# Patient Record
Sex: Female | Born: 1990 | Race: Black or African American | Hispanic: No | Marital: Single | Smoking: Never smoker
Health system: Southern US, Community
[De-identification: ages and names within clinical notes are randomized; demographics above are authoritative.]

## PROBLEM LIST (undated history)

## (undated) ENCOUNTER — Inpatient Hospital Stay (HOSPITAL_COMMUNITY): Payer: Self-pay

## (undated) DIAGNOSIS — A6000 Herpesviral infection of urogenital system, unspecified: Secondary | ICD-10-CM

## (undated) DIAGNOSIS — D573 Sickle-cell trait: Secondary | ICD-10-CM

## (undated) DIAGNOSIS — A749 Chlamydial infection, unspecified: Secondary | ICD-10-CM

## (undated) HISTORY — DX: Sickle-cell trait: D57.3

## (undated) HISTORY — PX: DILATION AND CURETTAGE OF UTERUS: SHX78

## (undated) HISTORY — PX: DENTAL SURGERY: SHX609

---

## 2012-12-08 ENCOUNTER — Encounter (HOSPITAL_COMMUNITY): Payer: Self-pay | Admitting: *Deleted

## 2012-12-08 ENCOUNTER — Inpatient Hospital Stay (HOSPITAL_COMMUNITY)
Admission: AD | Admit: 2012-12-08 | Discharge: 2012-12-08 | Disposition: A | Discharge status: Home / Self Care | Payer: Medicaid Other | Source: Ambulatory Visit | Attending: Obstetrics & Gynecology | Admitting: Obstetrics & Gynecology

## 2012-12-08 DIAGNOSIS — M549 Dorsalgia, unspecified: Secondary | ICD-10-CM | POA: Insufficient documentation

## 2012-12-08 DIAGNOSIS — O99891 Other specified diseases and conditions complicating pregnancy: Secondary | ICD-10-CM | POA: Insufficient documentation

## 2012-12-08 DIAGNOSIS — Z3201 Encounter for pregnancy test, result positive: Secondary | ICD-10-CM

## 2012-12-08 DIAGNOSIS — Z3481 Encounter for supervision of other normal pregnancy, first trimester: Secondary | ICD-10-CM

## 2012-12-08 DIAGNOSIS — R109 Unspecified abdominal pain: Secondary | ICD-10-CM | POA: Insufficient documentation

## 2012-12-08 DIAGNOSIS — R1084 Generalized abdominal pain: Secondary | ICD-10-CM

## 2012-12-08 LAB — POCT PREGNANCY, URINE: Preg Test, Ur: POSITIVE — AB

## 2012-12-08 LAB — URINALYSIS, ROUTINE W REFLEX MICROSCOPIC
Hgb urine dipstick: NEGATIVE
Nitrite: POSITIVE — AB
Specific Gravity, Urine: >1.03 — ABNORMAL HIGH (ref 1.005–1.030)
Urobilinogen, UA: 0.2 mg/dL (ref 0.0–1.0)
pH: 5.5 (ref 5.0–8.0)

## 2012-12-08 LAB — URINE MICROSCOPIC-ADD ON

## 2012-12-08 NOTE — MAU Provider Note (Signed)
History     CSN: [id number]  Arrival date and time: 12/08/12 7829   First Provider Initiated Contact with Patient 12/08/12 2246      Chief Complaint  Patient presents with  . Abdominal Pain  . Possible Pregnancy   HPI Bridget Leach 22 y.o. [redacted]w[redacted]d   Client has had midline back pain and nausea for several day.  Feels bad when she awakens.  Client was unaware she was pregnant.  Stunned by positive pregnancy test.  This week applied for insurance online and took papers to the [facility].   OB History   Grav Para Term Preterm Abortions TAB SAB Ect Mult Living   1               History reviewed. No pertinent past medical history.  Past Surgical History  Procedure Laterality Date  . Dental surgery      History reviewed. No pertinent family history.  History  Substance Use Topics  . Smoking status: Never Smoker   . Smokeless tobacco: Not on file  . Alcohol Use: No    Allergies: No Known Allergies  No prescriptions prior to admission    Review of Systems  Constitutional: Negative for fever.  Gastrointestinal: Positive for nausea. Negative for vomiting and abdominal pain.  Genitourinary:       No vaginal discharge. No vaginal bleeding. No dysuria.  Musculoskeletal: Positive for back pain.   Physical Exam   Blood pressure 117/71, pulse 93, temperature 98.3 F (36.8 C), temperature source Oral, resp. rate 18, height 5\' 2"  (1.575 m), weight 166 lb (75.297 kg), last menstrual period 09/04/2012, SpO2 100.00%.  Physical Exam  Nursing note and vitals reviewed. Constitutional: She is oriented to person, place, and time. She appears well-developed and well-nourished.  HENT:  Head: Normocephalic.  Eyes: EOM are normal.  Neck: Neck supple.  GI: Soft. There is no tenderness.  FHT 153 with doppler.  Musculoskeletal: Normal range of motion.  Midline sacral pain  - no CVA tenderness.  Neurological: She is alert and oriented to person, place, and time.  Skin: Skin is warm and  dry.  Psychiatric: She has a normal mood and affect.    MAU Course  Procedures Results for orders placed during the hospital encounter of 12/08/12 (from the past 24 hour(s))  URINALYSIS, ROUTINE W REFLEX MICROSCOPIC     Status: Abnormal   Collection Time    12/08/12  7:29 PM      Result Value Range   Color, Urine YELLOW  YELLOW   APPearance CLEAR  CLEAR   Specific Gravity, Urine >1.030 (*) 1.005 - 1.030   pH 5.5  5.0 - 8.0   Glucose, UA NEGATIVE  NEGATIVE mg/dL   Hgb urine dipstick NEGATIVE  NEGATIVE   Bilirubin Urine NEGATIVE  NEGATIVE   Ketones, ur NEGATIVE  NEGATIVE mg/dL   Protein, ur NEGATIVE  NEGATIVE mg/dL   Urobilinogen, UA 0.2  0.0 - 1.0 mg/dL   Nitrite POSITIVE (*) NEGATIVE   Leukocytes, UA NEGATIVE  NEGATIVE  URINE MICROSCOPIC-ADD ON     Status: None   Collection Time    12/08/12  7:29 PM      Result Value Range   Squamous Epithelial / LPF RARE  RARE   WBC, UA 0-2  <3 WBC/hpf   RBC / HPF 0-2  <3 RBC/hpf   Bacteria, UA RARE  RARE   Urine-Other MUCOUS PRESENT    POCT PREGNANCY, URINE     Status: Abnormal   Collection  Time    12/08/12  8:30 PM      Result Value Range   Preg Test, Ur POSITIVE (*) NEGATIVE    MDM Discussed positive pregnancy test and normal pregnancy symptoms.  Assessment and Plan  IUP  Plan Your pregnancy test is positive.  No smoking, no drugs, no alcohol.  Take a prenatal vitamin one by mouth every day.  Eat small frequent snacks to avoid nausea.  Begin prenatal care as soon as possible. Eat small amounts evey 2-3 hours Drink at least 8 8-oz glasses of water every day. Take Tylenol 325 mg 2 tablets by mouth every 4 hours if needed for pain. Pregnancy verification form given. Bridget Leach 12/08/2012, 10:58 PM

## 2012-12-08 NOTE — MAU Note (Signed)
Pt reports lower back pain, lower abd pain x one month off/on. States in the morning when she gets up she feels like she is "going to black out", nausea off/on x 2 weeks.

## 2012-12-16 ENCOUNTER — Other Ambulatory Visit: Payer: Self-pay

## 2012-12-16 DIAGNOSIS — Z3401 Encounter for supervision of normal first pregnancy, first trimester: Secondary | ICD-10-CM

## 2012-12-16 LAB — HIV ANTIBODY (ROUTINE TESTING W REFLEX): HIV: NONREACTIVE

## 2012-12-17 LAB — OBSTETRIC PANEL
Antibody Screen: NEGATIVE
Basophils Relative: 0 % (ref 0–1)
Eosinophils Absolute: 0 10*3/uL (ref 0.0–0.7)
HCT: 35.6 % — ABNORMAL LOW (ref 36.0–46.0)
Hemoglobin: 11.6 g/dL — ABNORMAL LOW (ref 12.0–15.0)
MCH: 24.6 pg — ABNORMAL LOW (ref 26.0–34.0)
MCHC: 32.6 g/dL (ref 30.0–36.0)
Monocytes Absolute: 0.6 10*3/uL (ref 0.1–1.0)
Monocytes Relative: 7 % (ref 3–12)
Rh Type: POSITIVE

## 2012-12-18 LAB — HEMOGLOBINOPATHY EVALUATION
Hgb A2 Quant: 2.8 % (ref 2.2–3.2)
Hgb A: 57.9 % — ABNORMAL LOW (ref 96.8–97.8)

## 2012-12-22 ENCOUNTER — Encounter: Payer: Self-pay | Admitting: Obstetrics & Gynecology

## 2012-12-22 DIAGNOSIS — D573 Sickle-cell trait: Secondary | ICD-10-CM | POA: Insufficient documentation

## 2012-12-22 HISTORY — DX: Sickle-cell trait: D57.3

## 2012-12-23 ENCOUNTER — Ambulatory Visit (HOSPITAL_COMMUNITY)
Admission: RE | Admit: 2012-12-23 | Discharge: 2012-12-23 | Disposition: A | Discharge status: Home / Self Care | Payer: Medicaid Other | Source: Ambulatory Visit | Attending: Obstetrics & Gynecology | Admitting: Obstetrics & Gynecology

## 2012-12-23 ENCOUNTER — Other Ambulatory Visit: Payer: Self-pay | Admitting: Obstetrics & Gynecology

## 2012-12-23 DIAGNOSIS — Z3401 Encounter for supervision of normal first pregnancy, first trimester: Secondary | ICD-10-CM

## 2012-12-23 DIAGNOSIS — Z3689 Encounter for other specified antenatal screening: Secondary | ICD-10-CM | POA: Insufficient documentation

## 2013-01-20 ENCOUNTER — Encounter: Payer: Self-pay | Admitting: Advanced Practice Midwife

## 2013-10-10 ENCOUNTER — Encounter (HOSPITAL_COMMUNITY): Payer: Self-pay | Admitting: Emergency Medicine

## 2013-10-10 ENCOUNTER — Emergency Department (INDEPENDENT_AMBULATORY_CARE_PROVIDER_SITE_OTHER)
Admission: EM | Admit: 2013-10-10 | Discharge: 2013-10-10 | Disposition: A | Discharge status: Home / Self Care | Payer: Medicaid Other | Source: Home / Self Care

## 2013-10-10 DIAGNOSIS — J309 Allergic rhinitis, unspecified: Secondary | ICD-10-CM

## 2013-10-10 DIAGNOSIS — R519 Headache, unspecified: Secondary | ICD-10-CM

## 2013-10-10 DIAGNOSIS — R51 Headache: Secondary | ICD-10-CM

## 2013-10-10 MED ORDER — KETOROLAC TROMETHAMINE 60 MG/2ML IM SOLN
60.0000 mg | Freq: Once | INTRAMUSCULAR | Status: AC
  Administered 2013-10-10: 60 mg via INTRAMUSCULAR

## 2013-10-10 MED ORDER — FLUTICASONE PROPIONATE 50 MCG/ACT NA SUSP: 2.0000 | Freq: Every day | NASAL | Status: DC

## 2013-10-10 MED ORDER — KETOROLAC TROMETHAMINE 60 MG/2ML IM SOLN
INTRAMUSCULAR | Status: AC
  Filled 2013-10-10: qty 2

## 2013-10-10 MED ORDER — CETIRIZINE-PSEUDOEPHEDRINE ER 5-120 MG PO TB12: 1.0000 | ORAL_TABLET | Freq: Every day | ORAL | Status: DC

## 2013-10-10 NOTE — Discharge Instructions (Signed)
Sinus Headache A sinus headache is when your sinuses become clogged or swollen. Sinus headaches can range from mild to severe.  CAUSES A sinus headache can have different causes, such as:  Colds.  Sinus infections.  Allergies. SYMPTOMS  Symptoms of a sinus headache may vary and can include:  Headache.  Pain or pressure in the face.  Congested or runny nose.  Fever.  Inability to smell.  Pain in upper teeth. Weather changes can make symptoms worse. TREATMENT  The treatment of a sinus headache depends on the cause.  Sinus pain caused by a sinus infection may be treated with antibiotic medicine.  Sinus pain caused by allergies may be helped by allergy medicines (antihistamines) and medicated nasal sprays.  Sinus pain caused by congestion may be helped by flushing the nose and sinuses with saline solution. HOME CARE INSTRUCTIONS   If antibiotics are prescribed, take them as directed. Finish them even if you start to feel better.  Only take over-the-counter or prescription medicines for pain, discomfort, or fever as directed by your caregiver.  If you have congestion, use a nasal spray to help reduce pressure. SEEK IMMEDIATE MEDICAL CARE IF:  You have a fever.  You have headaches more than once a week.  You have sensitivity to light or sound.  You have repeated nausea and vomiting.  You have vision problems.  You have sudden, severe pain in your face or head.  You have a seizure.  You are confused.  Your sinus headaches do not get better after treatment. Many people think they have a sinus headache when they actually have migraines or tension headaches. MAKE SURE YOU:   Understand these instructions.  Will watch your condition.  Will get help right away if you are not doing well or get worse. Document Released: 10/03/2004 Document Revised: 11/18/2011 Document Reviewed: 11/24/2010 [facility] Patient Information 2014 [place], [place].   Allergic  Rhinitis Allergic rhinitis is when the mucous membranes in the nose respond to allergens. Allergens are particles in the air that cause your body to have an allergic reaction. This causes you to release allergic antibodies. Through a chain of events, these eventually cause you to release histamine into the blood stream. Although meant to protect the body, it is this release of histamine that causes your discomfort, such as frequent sneezing, congestion, and an itchy, runny nose.  CAUSES  Seasonal allergic rhinitis (hay fever) is caused by pollen allergens that may come from grasses, trees, and weeds. Year-round allergic rhinitis (perennial allergic rhinitis) is caused by allergens such as house dust mites, pet dander, and mold spores.  SYMPTOMS   Nasal stuffiness (congestion).  Itchy, runny nose with sneezing and tearing of the eyes. DIAGNOSIS  Your health care provider can help you determine the allergen or allergens that trigger your symptoms. If you and your health care provider are unable to determine the allergen, skin or blood testing may be used. TREATMENT  Allergic Rhinitis does not have a cure, but it can be controlled by:  Medicines and allergy shots (immunotherapy).  Avoiding the allergen. Hay fever may often be treated with antihistamines in pill or nasal spray forms. Antihistamines block the effects of histamine. There are over-the-counter medicines that may help with nasal congestion and swelling around the eyes. Check with your health care provider before taking or giving this medicine.  If avoiding the allergen or the medicine prescribed do not work, there are many new medicines your health care provider can prescribe. Stronger medicine may  be used if initial measures are ineffective. Desensitizing injections can be used if medicine and avoidance does not work. Desensitization is when a patient is given ongoing shots until the body becomes less sensitive to the allergen. Make sure  you follow up with your health care provider if problems continue. HOME CARE INSTRUCTIONS It is not possible to completely avoid allergens, but you can reduce your symptoms by taking steps to limit your exposure to them. It helps to know exactly what you are allergic to so that you can avoid your specific triggers. SEEK MEDICAL CARE IF:   You have a fever.  You develop a cough that does not stop easily (persistent).  You have shortness of breath.  You start wheezing.  Symptoms interfere with normal daily activities. Document Released: 05/21/2001 Document Revised: 06/16/2013 Document Reviewed: 05/03/2013 [facility] Patient Information 2014 [place].

## 2013-10-10 NOTE — ED Notes (Signed)
C/o headache and nausea off/on  For the past 3 days.  No relief with otc meds.  Denies any other symptoms.

## 2013-10-10 NOTE — ED Provider Notes (Signed)
CSN: [id number]     Arrival date & time 10/10/13  1825 History   None    Chief Complaint  Patient presents with  . Headache  . Nausea   (Consider location/radiation/quality/duration/timing/severity/associated sxs/prior Treatment)  HPI  The patient is a year-old female presenting tonight with complaints of a headache x3 days. Patient states that she has had no history of headaches or migraines. Denies photophobia or aura.  The patient states she was sick approximately one week ago. Patient denies sore throat fever cough congestion nausea vomiting or diarrhea.   Past Medical History  Diagnosis Date  . Sickle cell trait carrier 12/22/2012    Hgb AS   Past Surgical History  Procedure Laterality Date  . Dental surgery     History reviewed. No pertinent family history. History  Substance Use Topics  . Smoking status: Never Smoker   . Smokeless tobacco: Not on file  . Alcohol Use: No   OB History   Grav Para Term Preterm Abortions TAB SAB Ect Mult Living   1              Review of Systems  Constitutional: Negative.   HENT: Positive for congestion. Negative for ear pain, sinus pressure, sneezing and sore throat.   Eyes: Negative.   Respiratory: Negative.  Negative for shortness of breath.   Cardiovascular: Negative.   Gastrointestinal: Negative.   Endocrine: Negative.   Genitourinary: Negative.   Musculoskeletal: Negative.   Skin: Negative.   Allergic/Immunologic: Positive for environmental allergies.  Neurological: Negative.   Hematological: Negative.   Psychiatric/Behavioral: Negative.     Allergies  Review of patient's allergies indicates no known allergies.  Home Medications   Current Outpatient Rx  Name  Route  Sig  Dispense  Refill  . cetirizine-pseudoephedrine (ZYRTEC-D) 5-120 MG per tablet   Oral   Take 1 tablet by mouth daily.   15 tablet   0   . fluticasone (FLONASE) 50 MCG/ACT nasal spray   Each Nare   Place 2 sprays into both nostrils daily.   16  g   2    BP 132/75  Pulse 110  Temp(Src) 98.8 F (37.1 C) (Oral)  Resp 19  SpO2 100%  LMP 08/09/2012  Breastfeeding? Unknown   Physical Exam  Nursing note and vitals reviewed. Constitutional: She is oriented to person, place, and time. She appears well-developed and well-nourished. No distress.  HENT:  Head: Normocephalic and atraumatic.  Right Ear: External ear normal.  Left Ear: External ear normal.  Mouth/Throat: Oropharynx is clear and moist. No oropharyngeal exudate.  Nasal turbinates appear boggy and swollen. Nares patent bilaterally. Lateral tympanic membranes pearly gray in appearance however light reflexes are distorted secondary to fluid present behind the membrane.    Eyes: EOM are normal. Pupils are equal, round, and reactive to light. Right eye exhibits no discharge. Left eye exhibits no discharge. No scleral icterus.  Neck: Normal range of motion. Neck supple. No tracheal deviation present.  Cardiovascular: Normal rate, regular rhythm, normal heart sounds and intact distal pulses.  Exam reveals no gallop and no friction rub.   No murmur heard. Pulmonary/Chest: Effort normal and breath sounds normal. No respiratory distress. She has no wheezes. She has no rales. She exhibits no tenderness.  Lymphadenopathy:    She has no cervical adenopathy.  Neurological: She is alert and oriented to person, place, and time. She has normal reflexes. She displays normal reflexes. No cranial nerve deficit. She exhibits normal muscle tone. Coordination normal.  Skin: Skin is warm and dry. No rash noted. She is not diaphoretic. No erythema. No pallor.    ED Course  Procedures (including critical care time) Labs Review Labs Reviewed - No data to display Imaging Review No results found.    Meds ordered this encounter  Medications  . ketorolac (TORADOL) injection 60 mg    Sig:   . fluticasone (FLONASE) 50 MCG/ACT nasal spray    Sig: Place 2 sprays into both nostrils daily.     Dispense:  16 g    Refill:  2  . cetirizine-pseudoephedrine (ZYRTEC-D) 5-120 MG per tablet    Sig: Take 1 tablet by mouth daily.    Dispense:  15 tablet    Refill:  0     MDM   1. Sinus headache   2. Allergic rhinitis    The plan of care discussed with mother and patient. Patient verbalizes understanding.    Weber Cooksatherine Treston Coker, NP 10/10/13 1911

## 2013-10-10 NOTE — ED Provider Notes (Signed)
Medical screening examination/treatment/procedure(s) were performed by non-physician practitioner and as supervising physician I was immediately available for consultation/collaboration.  Corryn Madewell, M.D.  Nikkita Adeyemi C Aneira Cavitt, MD 10/10/13 2102 

## 2013-12-07 ENCOUNTER — Encounter: Payer: Self-pay | Admitting: Family Medicine

## 2013-12-07 ENCOUNTER — Ambulatory Visit (INDEPENDENT_AMBULATORY_CARE_PROVIDER_SITE_OTHER): Payer: Medicaid Other | Admitting: Family Medicine

## 2013-12-07 VITALS — BP 117/85 | HR 83 | Ht 62.0 in | Wt 184.0 lb

## 2013-12-07 DIAGNOSIS — Z01812 Encounter for preprocedural laboratory examination: Secondary | ICD-10-CM

## 2013-12-07 DIAGNOSIS — Z30430 Encounter for insertion of intrauterine contraceptive device: Secondary | ICD-10-CM

## 2013-12-07 DIAGNOSIS — Z7251 High risk heterosexual behavior: Secondary | ICD-10-CM

## 2013-12-07 LAB — POCT URINE PREGNANCY: PREG TEST UR: NEGATIVE

## 2013-12-07 MED ORDER — LEVONORGESTREL 0.75 MG PO TABS: 0.7500 mg | ORAL_TABLET | Freq: Two times a day (BID) | ORAL | Status: DC

## 2013-12-07 NOTE — Patient Instructions (Signed)
Levonorgestrel intrauterine device (IUD) What is this medicine? LEVONORGESTREL IUD (LEE voe nor jes trel) is a contraceptive (birth control) device. The device is placed inside the uterus by a healthcare professional. It is used to prevent pregnancy and can also be used to treat heavy bleeding that occurs during your period. Depending on the device, it can be used for 3 to 5 years. This medicine may be used for other purposes; ask your health care provider or pharmacist if you have questions. COMMON BRAND NAME(S): Mirena, Skyla What should I tell my health care provider before I take this medicine? They need to know if you have any of these conditions: -abnormal Pap smear -cancer of the breast, uterus, or cervix -diabetes -endometritis -genital or pelvic infection now or in the past -have more than one sexual partner or your partner has more than one partner -heart disease -history of an ectopic or tubal pregnancy -immune system problems -IUD in place -liver disease or tumor -problems with blood clots or take blood-thinners -use intravenous drugs -uterus of unusual shape -vaginal bleeding that has not been explained -an unusual or allergic reaction to levonorgestrel, other hormones, silicone, or polyethylene, medicines, foods, dyes, or preservatives -pregnant or trying to get pregnant -breast-feeding How should I use this medicine? This device is placed inside the uterus by a health care professional. Talk to your pediatrician regarding the use of this medicine in children. Special care may be needed. Overdosage: If you think you have taken too much of this medicine contact a poison control center or emergency room at once. NOTE: This medicine is only for you. Do not share this medicine with others. What if I miss a dose? This does not apply. What may interact with this medicine? Do not take this medicine with any of the following  medications: -amprenavir -bosentan -fosamprenavir This medicine may also interact with the following medications: -aprepitant -barbiturate medicines for inducing sleep or treating seizures -bexarotene -griseofulvin -medicines to treat seizures like carbamazepine, ethotoin, felbamate, oxcarbazepine, phenytoin, topiramate -modafinil -pioglitazone -rifabutin -rifampin -rifapentine -some medicines to treat HIV infection like atazanavir, indinavir, lopinavir, nelfinavir, tipranavir, ritonavir -St. John's wort -warfarin This list may not describe all possible interactions. Give your health care provider a list of all the medicines, herbs, non-prescription drugs, or dietary supplements you use. Also tell them if you smoke, drink alcohol, or use illegal drugs. Some items may interact with your medicine. What should I watch for while using this medicine? Visit your doctor or health care professional for regular check ups. See your doctor if you or your partner has sexual contact with others, becomes HIV positive, or gets a sexual transmitted disease. This product does not protect you against HIV infection (AIDS) or other sexually transmitted diseases. You can check the placement of the IUD yourself by reaching up to the top of your vagina with clean fingers to feel the threads. Do not pull on the threads. It is a good habit to check placement after each menstrual period. Call your doctor right away if you feel more of the IUD than just the threads or if you cannot feel the threads at all. The IUD may come out by itself. You may become pregnant if the device comes out. If you notice that the IUD has come out use a backup birth control method like condoms and call your health care provider. Using tampons will not change the position of the IUD and are okay to use during your period. What side effects may I   notice from receiving this medicine? Side effects that you should report to your doctor or  health care professional as soon as possible: -allergic reactions like skin rash, itching or hives, swelling of the face, lips, or tongue -fever, flu-like symptoms -genital sores -high blood pressure -no menstrual period for 6 weeks during use -pain, swelling, warmth in the leg -pelvic pain or tenderness -severe or sudden headache -signs of pregnancy -stomach cramping -sudden shortness of breath -trouble with balance, talking, or walking -unusual vaginal bleeding, discharge -yellowing of the eyes or skin Side effects that usually do not require medical attention (report to your doctor or health care professional if they continue or are bothersome): -acne -breast pain -change in sex drive or performance -changes in weight -cramping, dizziness, or faintness while the device is being inserted -headache -irregular menstrual bleeding within first 3 to 6 months of use -nausea This list may not describe all possible side effects. Call your doctor for medical advice about side effects. You may report side effects to FDA at 1-800-FDA-1088. Where should I keep my medicine? This does not apply. NOTE: This sheet is a summary. It may not cover all possible information. If you have questions about this medicine, talk to your doctor, pharmacist, or health care provider.  2014, Elsevier/Gold Standard. (2011-09-26 13:54:04)  

## 2013-12-07 NOTE — Progress Notes (Signed)
   Subjective:    Patient ID: Bridget Leach is a 23 y.o. female presenting with Contraception  on 12/07/2013  HPI: New pt. Here for IUD insertion.  She reports unprotected intercourse last pm.  LMP was 3/13.  Neg UPT today.  Review of Systems  Constitutional: Negative for fever and chills.  Respiratory: Negative for shortness of breath.   Cardiovascular: Negative for chest pain.  Gastrointestinal: Negative for nausea, vomiting and abdominal pain.  Genitourinary: Negative for dysuria.  Skin: Negative for rash.      Objective:    BP 117/85  Pulse 83  Ht 5\' 2"  (1.575 m)  Wt 184 lb (83.462 kg)  BMI 33.65 kg/m2  LMP 11/19/2013  Breastfeeding? No Physical Exam  Constitutional: She is oriented to person, place, and time. She appears well-developed and well-nourished. No distress.  HENT:  Head: Normocephalic and atraumatic.  Eyes: No scleral icterus.  Neck: Neck supple.  Cardiovascular: Normal rate.   Pulmonary/Chest: Effort normal.  Abdominal: Soft.  Neurological: She is alert and oriented to person, place, and time.  Skin: Skin is warm and dry.  Psychiatric: She has a normal mood and affect.        Assessment & Plan:  No IUD to be inserted today secondary to above. Plan B today  Return in about 2 weeks (around 12/21/2013) for IUD insertion. with menses and neg. UPT.

## 2013-12-20 ENCOUNTER — Ambulatory Visit: Payer: Medicaid Other | Admitting: Family Medicine

## 2014-08-07 IMAGING — US US OB LIMITED
1 series · 13 of 24 positions shown · non-contrast
Comparison: none

[Series 1: us ob comp less 14 wks · 13 of 24 slices shown]
[im 1/24]
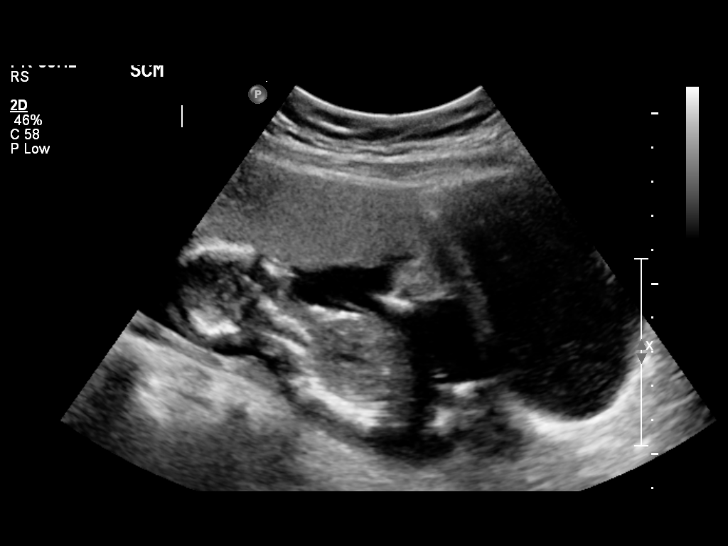
[im 3/24]
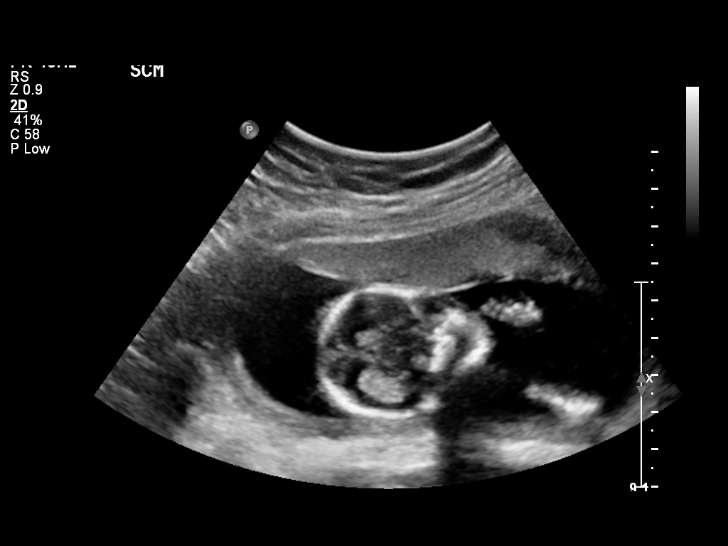
[im 5/24]
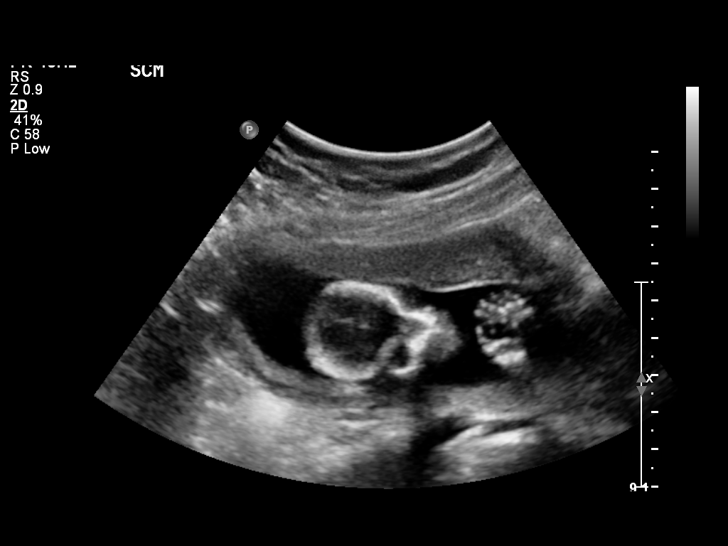
[im 7/24]
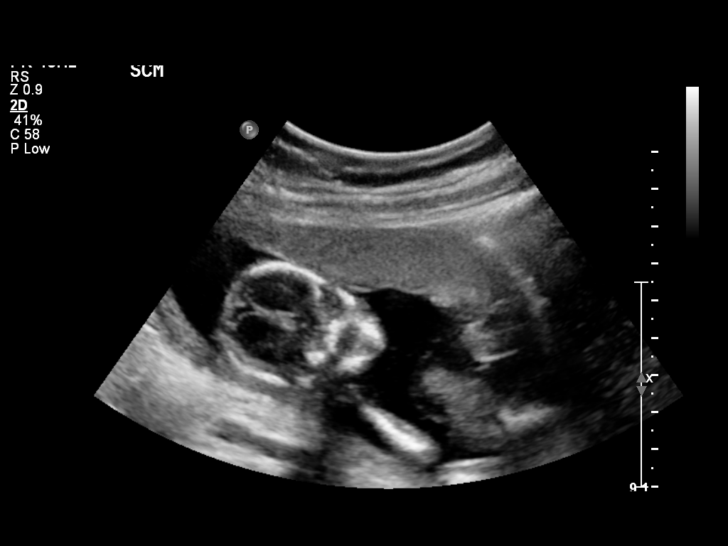
[im 9/24]
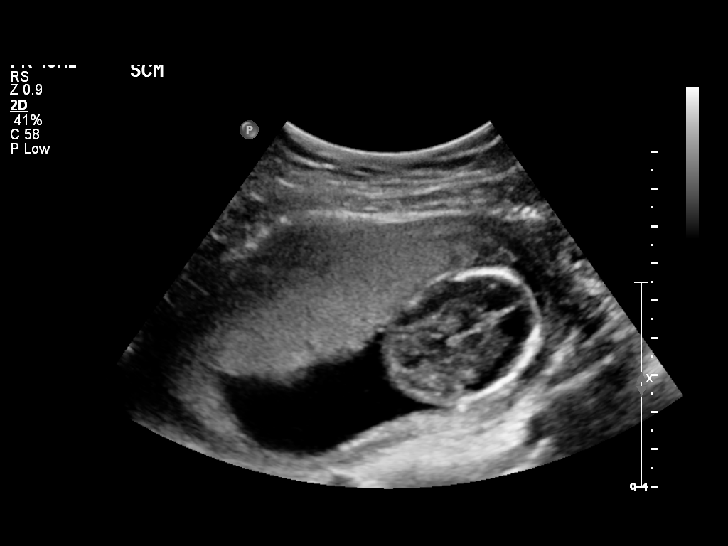
[im 11/24]
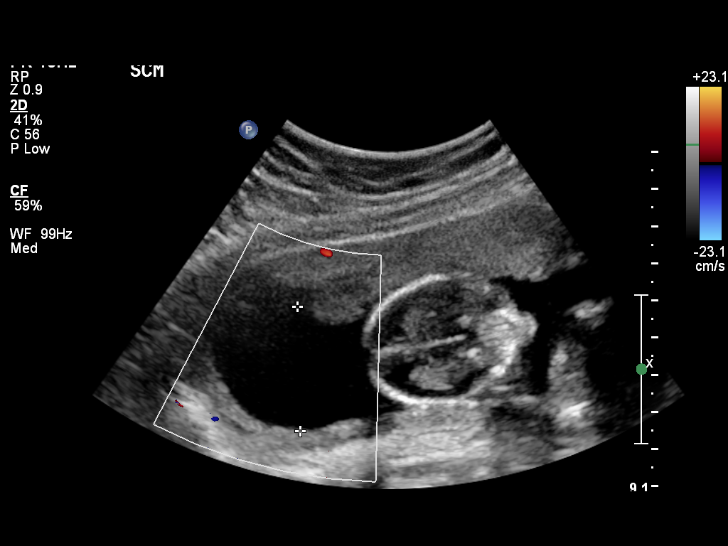
[im 13/24]
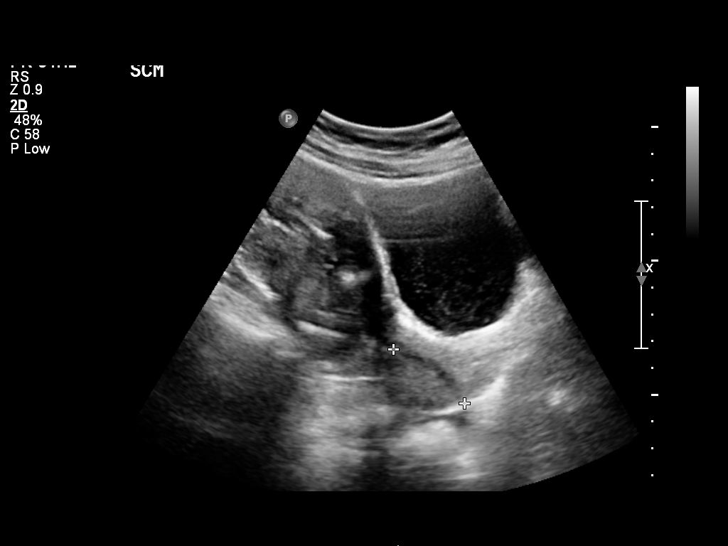
[im 14/24]
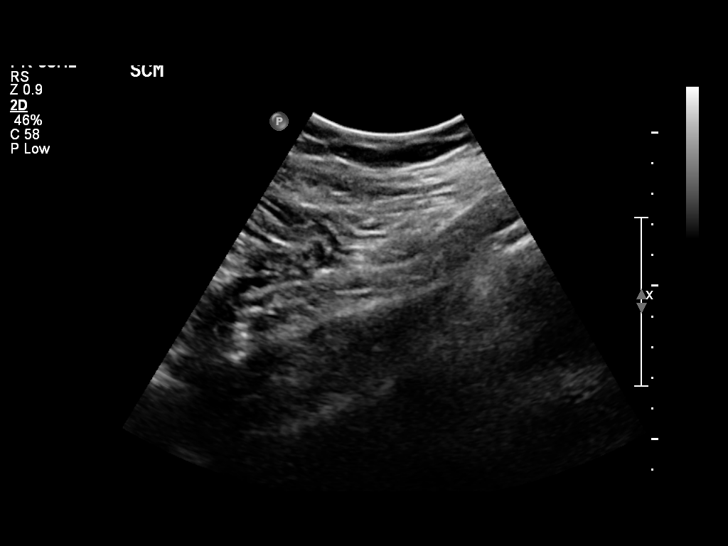
[im 16/24]
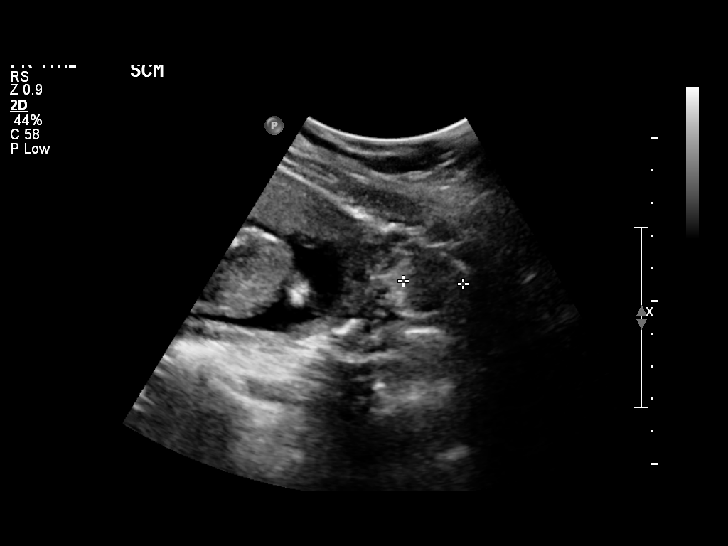
[im 18/24]
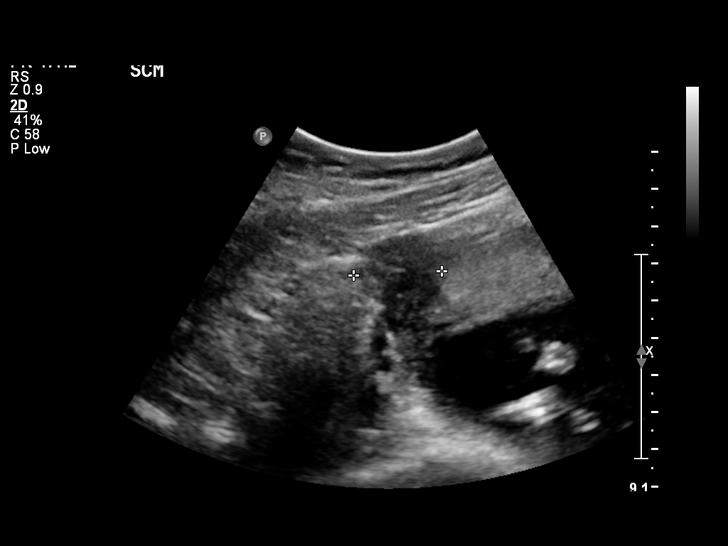
[im 20/24]
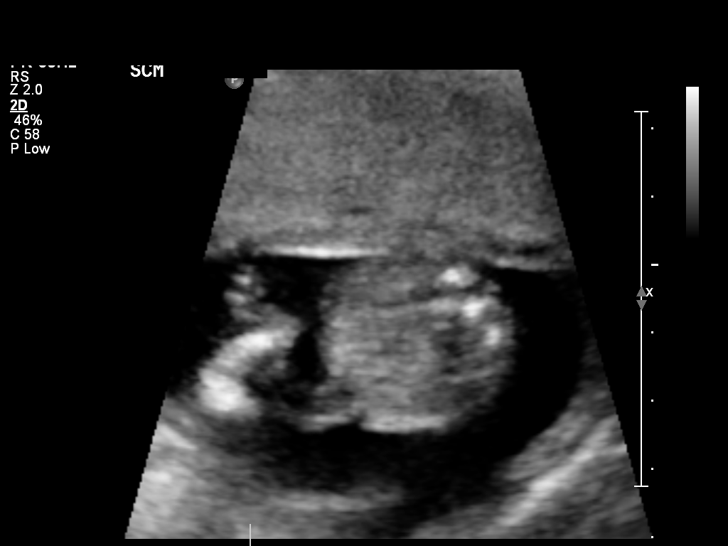
[im 22/24]
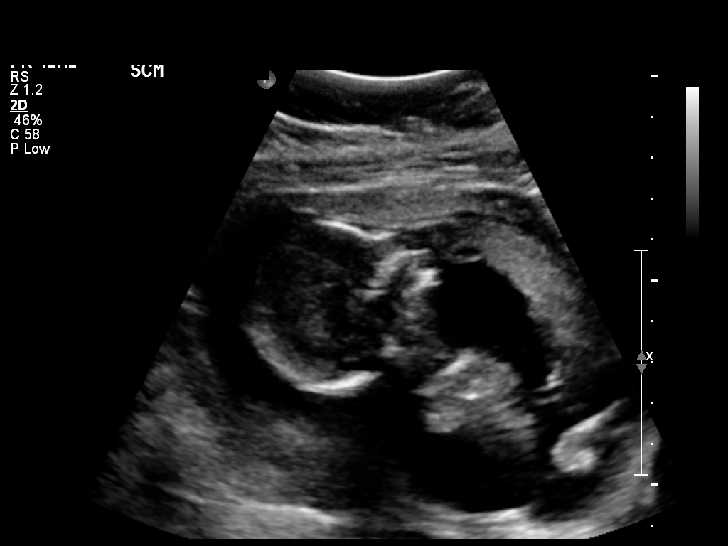
[im 24/24]
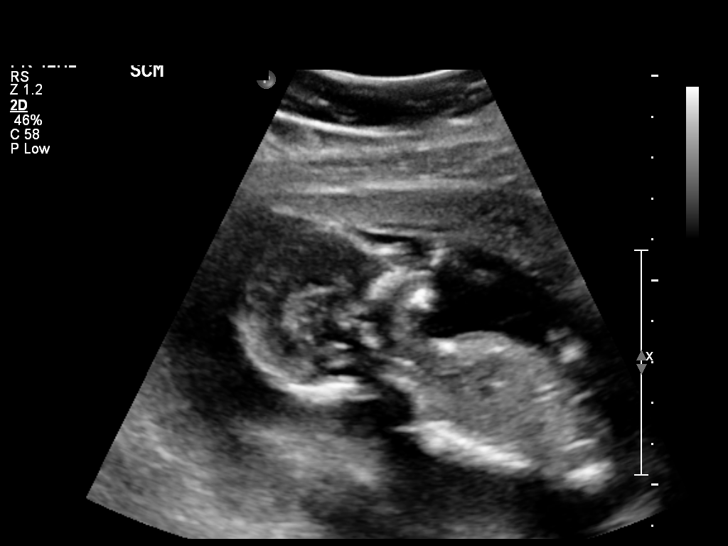

[13 of 24 positions shown; findings below may reference images not displayed]

OBSTETRICS REPORT
                    (Corrected Final 12/24/2012 [DATE])

Service(s) Provided

 [HOSPITAL]                                         76815.0
Indications

 Unsure of LMP;  Establish Gestational [AGE]
Fetal Evaluation

 Num Of Fetuses:    1
 Fetal Heart Rate:  140                          bpm
 Cardiac Activity:  Observed
 Presentation:      Breech
 Placenta:          Anterior, above cervical os

 Amniotic Fluid
 AFI FV:      Subjectively within normal limits
                                             Larg Pckt:    3.33  cm
Biometry

 BPD:     33.1  mm     G. Age:  16w 2d
Gestational Age

 LMP:           15w 5d        Date:  09/04/12                 EDD:   06/11/13
Cervix Uterus Adnexa

 Cervical Length:    3.3      cm

 Cervix:       Normal appearance by transabdominal scan.
 Uterus:       No abnormality visualized.
 Cul De Sac:   No free fluid seen.

 Left Ovary:    Within normal limits.
 Right Ovary:   Within normal limits.
 Adnexa:     No abnormality visualized.
Impression

 Single living IUP with estimated GA by ultrasound of 16w 2d
 and EDD of 06/07/2013.
 Normal amniotic fluid volume and cervical length.
Recommendations

 US for fetal anatomic evaluation at 18-19 wks GA.

                 Attending Physician, KIANDA

## 2015-03-22 LAB — OB RESULTS CONSOLE HEPATITIS B SURFACE ANTIGEN: HEP B S AG: NEGATIVE

## 2015-03-22 LAB — OB RESULTS CONSOLE RUBELLA ANTIBODY, IGM: Rubella: IMMUNE

## 2015-06-24 ENCOUNTER — Inpatient Hospital Stay (HOSPITAL_COMMUNITY)
Admission: AD | Admit: 2015-06-24 | Discharge: 2015-06-24 | Disposition: A | Discharge status: Home / Self Care | Payer: Medicaid Other | Source: Ambulatory Visit | Attending: Obstetrics and Gynecology | Admitting: Obstetrics and Gynecology

## 2015-06-24 ENCOUNTER — Inpatient Hospital Stay (HOSPITAL_COMMUNITY): Payer: Medicaid Other

## 2015-06-24 ENCOUNTER — Encounter (HOSPITAL_COMMUNITY): Payer: Self-pay | Admitting: *Deleted

## 2015-06-24 DIAGNOSIS — Z3A20 20 weeks gestation of pregnancy: Secondary | ICD-10-CM | POA: Insufficient documentation

## 2015-06-24 DIAGNOSIS — O98312 Other infections with a predominantly sexual mode of transmission complicating pregnancy, second trimester: Secondary | ICD-10-CM | POA: Insufficient documentation

## 2015-06-24 DIAGNOSIS — O321XX0 Maternal care for breech presentation, not applicable or unspecified: Secondary | ICD-10-CM | POA: Insufficient documentation

## 2015-06-24 DIAGNOSIS — A749 Chlamydial infection, unspecified: Secondary | ICD-10-CM

## 2015-06-24 DIAGNOSIS — O26899 Other specified pregnancy related conditions, unspecified trimester: Secondary | ICD-10-CM

## 2015-06-24 DIAGNOSIS — A568 Sexually transmitted chlamydial infection of other sites: Secondary | ICD-10-CM | POA: Insufficient documentation

## 2015-06-24 DIAGNOSIS — R109 Unspecified abdominal pain: Secondary | ICD-10-CM | POA: Diagnosis present

## 2015-06-24 HISTORY — DX: Chlamydial infection, unspecified: A74.9

## 2015-06-24 LAB — URINALYSIS, ROUTINE W REFLEX MICROSCOPIC
BILIRUBIN URINE: NEGATIVE
Glucose, UA: NEGATIVE mg/dL
HGB URINE DIPSTICK: NEGATIVE
KETONES UR: NEGATIVE mg/dL
Leukocytes, UA: NEGATIVE
Nitrite: NEGATIVE
PROTEIN: NEGATIVE mg/dL
Specific Gravity, Urine: 1.02 (ref 1.005–1.030)
UROBILINOGEN UA: 1 mg/dL (ref 0.0–1.0)
pH: 6.5 (ref 5.0–8.0)

## 2015-06-24 LAB — WET PREP, GENITAL
Clue Cells Wet Prep HPF POC: NONE SEEN
Trich, Wet Prep: NONE SEEN
YEAST WET PREP: NONE SEEN

## 2015-06-24 MED ORDER — ACETAMINOPHEN 325 MG PO TABS
650.0000 mg | ORAL_TABLET | Freq: Once | ORAL | Status: AC
  Administered 2015-06-24: 650 mg via ORAL
  Filled 2015-06-24: qty 2

## 2015-06-24 NOTE — MAU Provider Note (Signed)
Bridget Leach is a 24 y.o. G2P0 at 20.3 weeks presented to MAU unannounced c/o sinus HA x 3 days, abd cramps.  She denies vb or lof w/+FM.     History     Patient Active Problem List   Diagnosis Date Noted  . Sickle cell trait carrier 12/22/2012    Chief Complaint  Patient presents with  . Headache  . Abdominal Pain  . Constipation   HPI  OB History    Gravida Para Term Preterm AB TAB SAB Ectopic Multiple Living   2 0   1 1    0      Past Medical History  Diagnosis Date  . Sickle cell trait carrier 12/22/2012    Hgb AS    Past Surgical History  Procedure Laterality Date  . Dental surgery    . Dilation and curettage of uterus      Family History  Problem Relation Age of Onset  . Diabetes Maternal Grandmother   . Hypertension Maternal Grandmother   . Cancer Maternal Grandmother     leukemia  . Diabetes Maternal Grandfather   . Hypertension Maternal Grandfather     Social History  Substance Use Topics  . Smoking status: Never Smoker   . Smokeless tobacco: None  . Alcohol Use: No    Allergies: No Known Allergies  Prescriptions prior to admission  Medication Sig Dispense Refill Last Dose  . levonorgestrel (PLAN B) 0.75 MG tablet Take 1 tablet (0.75 mg total) by mouth every 12 (twelve) hours. (Patient not taking: Reported on 06/24/2015) 2 tablet 0 Completed Course at Unknown time    ROS See HPI above, all other systems are negative  Physical Exam   Blood pressure 124/80, pulse 88, temperature 98.3 F (36.8 C), temperature source Oral, resp. rate 16, height 5\' 3"  (1.6 m), weight 135 lb (61.236 kg), last menstrual period 02/01/2015.  Physical Exam Ext:  WNL ABD: Soft, non tender to palpation, no rebound or guarding SVE: c/t/h   ED Course  Assessment: IUP at  20.3weeks Membranes: intact FHR: Category 142 CTX:  none   Plan: Labs: wet prep, GC.CT US   Elvis Laufer, CNM, MSN 06/24/2015. 6:55 PM

## 2015-06-24 NOTE — Discharge Instructions (Signed)
Round Ligament Pain  The round ligament is a cord of muscle and tissue that helps to support the uterus. It can become a source of pain during pregnancy if it becomes stretched or twisted as the baby grows. The pain usually begins in the second trimester of pregnancy, and it can come and go until the baby is delivered. It is not a serious problem, and it does not cause harm to the baby.  Round ligament pain is usually a short, sharp, and pinching pain, but it can also be a dull, lingering, and aching pain. The pain is felt in the lower side of the abdomen or in the groin. It usually starts deep in the groin and moves up to the outside of the hip area. Pain can occur with:   A sudden change in position.   Rolling over in bed.   Coughing or sneezing.   Physical activity.  HOME CARE INSTRUCTIONS  Watch your condition for any changes. Take these steps to help with your pain:   When the pain starts, relax. Then try:    Sitting down.    Flexing your knees up to your abdomen.    Lying on your side with one pillow under your abdomen and another pillow between your legs.    Sitting in a warm bath for 15-20 minutes or until the pain goes away.   Take over-the-counter and prescription medicines only as told by your health care provider.   Move slowly when you sit and stand.   Avoid long walks if they cause pain.   Stop or lessen your physical activities if they cause pain.  SEEK MEDICAL CARE IF:   Your pain does not go away with treatment.   You feel pain in your back that you did not have before.   Your medicine is not helping.  SEEK IMMEDIATE MEDICAL CARE IF:   You develop a fever or chills.   You develop uterine contractions.   You develop vaginal bleeding.   You develop nausea or vomiting.   You develop diarrhea.   You have pain when you urinate.     This information is not intended to replace advice given to you by your health care provider. Make sure you discuss any questions you have with your health  care provider.     Document Released: 06/04/2008 Document Revised: 11/18/2011 Document Reviewed: 11/02/2014  Elsevier Interactive Patient Education 2016 [employer]

## 2015-06-24 NOTE — MAU Note (Addendum)
Patient presents at 4619 weeks gestation with c/o headache X 3 days; constipation X 1 week and abdominal cramping X couple of weeks. Fetus active. Denies bleeding but states she has a discharge X 2 weeks. States the discharge is "normal" and her OB is aware.

## 2015-06-26 LAB — GC/CHLAMYDIA PROBE AMP (~~LOC~~) NOT AT ARMC
Chlamydia: POSITIVE — AB
Neisseria Gonorrhea: NEGATIVE

## 2015-06-29 NOTE — Addendum Note (Signed)
Encounter addended by: Sherre ScarletKimberly Lief Palmatier, CNM on: 06/29/2015 11:39 PM<BR>     Documentation filed: Clinical Notes

## 2015-06-29 NOTE — MAU Provider Note (Signed)
Assumed care at 7 pm. In to discuss neg results of wet prep/UA and normal u/s. Informed will contact if abnormal results of GC/CT. Reports lapse in care due to transportation issues and is thinking of transferring care to a practice that is closer to her.  Recent Results (from the past 2160 hour(s))  GC/Chlamydia probe amp ([place])[facility]     Status: Abnormal   Collection Time: 06/24/15 12:00 AM  Result Value Ref Range   Chlamydia **POSITIVE** (A)     Comment: Normal Reference Range - Negative   Neisseria gonorrhea Negative     Comment: Normal Reference Range - Negative  Urinalysis, Routine w reflex microscopic (not at [facility])     Status: None   Collection Time: 06/24/15  5:30 PM  Result Value Ref Range   Color, Urine YELLOW YELLOW   APPearance CLEAR CLEAR   Specific Gravity, Urine 1.020 1.005 - 1.030   pH 6.5 5.0 - 8.0   Glucose, UA NEGATIVE NEGATIVE mg/dL   Hgb urine dipstick NEGATIVE NEGATIVE   Bilirubin Urine NEGATIVE NEGATIVE   Ketones, ur NEGATIVE NEGATIVE mg/dL   Protein, ur NEGATIVE NEGATIVE mg/dL   Urobilinogen, UA 1.0 0.0 - 1.0 mg/dL   Nitrite NEGATIVE NEGATIVE   Leukocytes, UA NEGATIVE NEGATIVE    Comment: MICROSCOPIC NOT DONE ON URINES WITH NEGATIVE PROTEIN, BLOOD, LEUKOCYTES, NITRITE, OR GLUCOSE <1000 mg/dL.  Wet prep, genital     Status: Abnormal   Collection Time: 06/24/15  6:48 PM  Result Value Ref Range   Yeast Wet Prep HPF POC NONE SEEN NONE SEEN   Trich, Wet Prep NONE SEEN NONE SEEN   Clue Cells Wet Prep HPF POC NONE SEEN NONE SEEN   WBC, Wet Prep HPF POC FEW (A) NONE SEEN    Comment: FEW BACTERIA SEEN    U/S results: SIUP at 20.3 wks. Active fetus in breech presentation. No previa. Amniotic fluid is gestational age appropriate. Cervix is long and closed measuring 2.8 cm.  D/C'd home in stable condition. H/A relieved w/ Tylenol. Strict PTL precautions given.  Sherre ScarletKimberly Nawal Burling, CNM 06/24/15, 10:30 PM   ADDENDUM: Reviewed GC/CT results.  +Chlamydia. [place] in [place] that [place] has been notified/treated.  Sherre ScarletKimberly Dijuan Sleeth, CNM 06/29/15, 11:20 PM

## 2015-08-31 ENCOUNTER — Encounter (HOSPITAL_COMMUNITY): Payer: Self-pay | Admitting: Emergency Medicine

## 2015-08-31 ENCOUNTER — Observation Stay (HOSPITAL_COMMUNITY)
Admission: EM | Admit: 2015-08-31 | Discharge: 2015-09-01 | Disposition: A | Discharge status: Other Acute Inpatient Hospital | Payer: Medicaid Other | Attending: Obstetrics and Gynecology | Admitting: Obstetrics and Gynecology

## 2015-08-31 DIAGNOSIS — Y92410 Unspecified street and highway as the place of occurrence of the external cause: Secondary | ICD-10-CM | POA: Diagnosis not present

## 2015-08-31 DIAGNOSIS — Y999 Unspecified external cause status: Secondary | ICD-10-CM | POA: Insufficient documentation

## 2015-08-31 DIAGNOSIS — Z3A30 30 weeks gestation of pregnancy: Secondary | ICD-10-CM | POA: Diagnosis not present

## 2015-08-31 DIAGNOSIS — O9989 Other specified diseases and conditions complicating pregnancy, childbirth and the puerperium: Secondary | ICD-10-CM | POA: Diagnosis not present

## 2015-08-31 DIAGNOSIS — Y9389 Activity, other specified: Secondary | ICD-10-CM | POA: Diagnosis not present

## 2015-08-31 DIAGNOSIS — M549 Dorsalgia, unspecified: Secondary | ICD-10-CM | POA: Diagnosis present

## 2015-08-31 DIAGNOSIS — Z3493 Encounter for supervision of normal pregnancy, unspecified, third trimester: Secondary | ICD-10-CM
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
## 2015-08-31 NOTE — Progress Notes (Signed)
[facility]link [facility]
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
## 2015-08-31 NOTE — Progress Notes (Signed)
[name], CNM notified that [facility]
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
## 2015-08-31 NOTE — ED Notes (Signed)
OB NURSE AT BEDSIDE. FETAL MONITORING IN PROGRESS.

## 2015-08-31 NOTE — Progress Notes (Signed)
[facility]; report given on patient Bridget BankerJinean Barnes, RN; patient will be transferred to room 153
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
## 2015-09-01 ENCOUNTER — Encounter (HOSPITAL_COMMUNITY): Payer: Self-pay | Admitting: *Deleted

## 2015-09-01 LAB — TYPE AND SCREEN
ABO/RH(D): O POS
ANTIBODY SCREEN: NEGATIVE

## 2015-09-01 LAB — ABO/RH: ABO/RH(D): O POS

## 2015-09-01 MED ORDER — PRENATAL MULTIVITAMIN CH
1.0000 | ORAL_TABLET | Freq: Every day | ORAL | Status: DC
  Filled 2015-09-01: qty 1

## 2015-09-01 MED ORDER — CALCIUM CARBONATE ANTACID 500 MG PO CHEW: 2.0000 | CHEWABLE_TABLET | ORAL | Status: DC | PRN

## 2015-09-01 MED ORDER — DOCUSATE SODIUM 100 MG PO CAPS
100.0000 mg | ORAL_CAPSULE | Freq: Every day | ORAL | Status: DC
  Filled 2015-09-01: qty 1

## 2015-09-01 MED ORDER — ZOLPIDEM TARTRATE 5 MG PO TABS
5.0000 mg | ORAL_TABLET | Freq: Every evening | ORAL | Status: DC | PRN
  Filled 2015-09-01: qty 1

## 2015-09-01 MED ORDER — ACETAMINOPHEN 325 MG PO TABS
650.0000 mg | ORAL_TABLET | ORAL | Status: DC | PRN
Start: 2015-09-01 — End: 2015-09-01
  Filled 2015-09-01: qty 2

## 2015-09-01 NOTE — ED Notes (Signed)
[facility] AR[facility]IV[facility] TO [facility] AAOX3. PT IN NO APPARENT DISTRESS. THE OPPORTUNITY TO ASK QUESTIONS WAS PROVIDED.
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
## 2015-09-01 NOTE — Progress Notes (Signed)
carelink arrived to transport patient  
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
## 2015-09-01 NOTE — Discharge Instructions (Signed)
Braxton Hicks Contractions °Contractions of the uterus can occur throughout pregnancy. Contractions are not always a sign that you are in labor.  °WHAT ARE BRAXTON HICKS CONTRACTIONS?  °Contractions that occur before labor are called Braxton Hicks contractions, or false labor. Toward the end of pregnancy (32-34 weeks), these contractions can develop more often and may become more forceful. This is not true labor because these contractions do not result in opening (dilatation) and thinning of the cervix. They are sometimes difficult to tell apart from true labor because these contractions can be forceful and people have different pain tolerances. You should not feel embarrassed if you go to the hospital with false labor. Sometimes, the only way to tell if you are in true labor is for your health care provider to look for changes in the cervix. °If there are no prenatal problems or other health problems associated with the pregnancy, it is completely safe to be sent home with false labor and await the onset of true labor. °HOW CAN YOU TELL THE DIFFERENCE BETWEEN TRUE AND FALSE LABOR? °False Labor °· The contractions of false labor are usually shorter and not as hard as those of true labor.   °· The contractions are usually irregular.   °· The contractions are often felt in the front of the lower abdomen and in the groin.   °· The contractions may go away when you walk around or change positions while lying down.   °· The contractions get weaker and are shorter lasting as time goes on.   °· The contractions do not usually become progressively stronger, regular, and closer together as with true labor.   °True Labor °· Contractions in true labor last 30-70 seconds, become very regular, usually become more intense, and increase in frequency.   °· The contractions do not go away with walking.   °· The discomfort is usually felt in the top of the uterus and spreads to the lower abdomen and low back.   °· True labor can be  determined by your health care provider with an exam. This will show that the cervix is dilating and getting thinner.   °WHAT TO REMEMBER °· Keep up with your usual exercises and follow other instructions given by your health care provider.   °· Take medicines as directed by your health care provider.   °· Keep your regular prenatal appointments.   °· Eat and drink lightly if you think you are going into labor.   °· If Braxton Hicks contractions are making you uncomfortable:   °¨ Change your position from lying down or resting to walking, or from walking to resting.   °¨ Sit and rest in a tub of warm water.   °¨ Drink 2-3 glasses of water. Dehydration may cause these contractions.   °¨ Do slow and deep breathing several times an hour.   °WHEN SHOULD I SEEK IMMEDIATE MEDICAL CARE? °Seek immediate medical care if: °· Your contractions become stronger, more regular, and closer together.   °· You have fluid leaking or gushing from your vagina.   °· You have a fever.   °· You pass blood-tinged mucus.   °· You have vaginal bleeding.   °· You have continuous abdominal pain.   °· You have low back pain that you never had before.   °· You feel your baby's head pushing down and causing pelvic pressure.   °· Your baby is not moving as much as it used to.   °  °This information is not intended to replace advice given to you by your health care provider. Make sure you discuss any questions you have with your health care   provider. °  °Document Released: 08/26/2005 Document Revised: 08/31/2013 Document Reviewed: 06/07/2013 °Elsevier Interactive Patient Education ©2016 [employer] °Fetal Movement Counts °Patient Name: __________________________________________________ Patient Due Date: ____________________ °Performing a fetal movement count is highly recommended in high-risk pregnancies, but it is good for every pregnant woman to do. Your health care provider may ask you to start counting fetal movements at 28 weeks of the  pregnancy. Fetal movements often increase: °· After eating a full meal. °· After physical activity. °· After eating or drinking something sweet or cold. °· At rest. °Pay attention to when you feel the baby is most active. This will help you notice a pattern of your baby's sleep and wake cycles and what factors contribute to an increase in fetal movement. It is important to perform a fetal movement count at the same time each day when your baby is normally most active.  °HOW TO COUNT FETAL MOVEMENTS °· Find a quiet and comfortable area to sit or lie down on your left side. Lying on your left side provides the best blood and oxygen circulation to your baby. °· Write down the day and time on a sheet of paper or in a journal. °· Start counting kicks, flutters, swishes, rolls, or jabs in a 2-hour period. You should feel at least 10 movements within 2 hours. °· If you do not feel 10 movements in 2 hours, wait 2-3 hours and count again. Look for a change in the pattern or not enough counts in 2 hours. °SEEK MEDICAL CARE IF: °· You feel less than 10 counts in 2 hours, tried twice. °· There is no movement in over an hour. °· The pattern is changing or taking longer each day to reach 10 counts in 2 hours. °· You feel the baby is not moving as he or she usually does. °Date: ____________ Movements: ____________ Start time: ____________ Finish time: ____________  °Date: ____________ Movements: ____________ Start time: ____________ Finish time: ____________ °Date: ____________ Movements: ____________ Start time: ____________ Finish time: ____________ °Date: ____________ Movements: ____________ Start time: ____________ Finish time: ____________ °Date: ____________ Movements: ____________ Start time: ____________ Finish time: ____________ °Date: ____________ Movements: ____________ Start time: ____________ Finish time: ____________ °Date: ____________ Movements: ____________ Start time: ____________ Finish time: ____________ °Date:  ____________ Movements: ____________ Start time: ____________ Finish time: ____________  °Date: ____________ Movements: ____________ Start time: ____________ Finish time: ____________ °Date: ____________ Movements: ____________ Start time: ____________ Finish time: ____________ °Date: ____________ Movements: ____________ Start time: ____________ Finish time: ____________ °Date: ____________ Movements: ____________ Start time: ____________ Finish time: ____________ °Date: ____________ Movements: ____________ Start time: ____________ Finish time: ____________ °Date: ____________ Movements: ____________ Start time: ____________ Finish time: ____________ °Date: ____________ Movements: ____________ Start time: ____________ Finish time: ____________  °Date: ____________ Movements: ____________ Start time: ____________ Finish time: ____________ °Date: ____________ Movements: ____________ Start time: ____________ Finish time: ____________ °Date: ____________ Movements: ____________ Start time: ____________ Finish time: ____________ °Date: ____________ Movements: ____________ Start time: ____________ Finish time: ____________ °Date: ____________ Movements: ____________ Start time: ____________ Finish time: ____________ °Date: ____________ Movements: ____________ Start time: ____________ Finish time: ____________ °Date: ____________ Movements: ____________ Start time: ____________ Finish time: ____________  °Date: ____________ Movements: ____________ Start time: ____________ Finish time: ____________ °Date: ____________ Movements: ____________ Start time: ____________ Finish time: ____________ °Date: ____________ Movements: ____________ Start time: ____________ Finish time: ____________ °Date: ____________ Movements: ____________ Start time: ____________ Finish time: ____________ °Date: ____________ Movements: ____________ Start time: ____________ Finish time: ____________ °Date: ____________ Movements: ____________ Start  time: ____________ Finish time:   ____________ °Date: ____________ Movements: ____________ Start time: ____________ Finish time: ____________  °Date: ____________ Movements: ____________ Start time: ____________ Finish time: ____________ °Date: ____________ Movements: ____________ Start time: ____________ Finish time: ____________ °Date: ____________ Movements: ____________ Start time: ____________ Finish time: ____________ °Date: ____________ Movements: ____________ Start time: ____________ Finish time: ____________ °Date: ____________ Movements: ____________ Start time: ____________ Finish time: ____________ °Date: ____________ Movements: ____________ Start time: ____________ Finish time: ____________ °Date: ____________ Movements: ____________ Start time: ____________ Finish time: ____________  °Date: ____________ Movements: ____________ Start time: ____________ Finish time: ____________ °Date: ____________ Movements: ____________ Start time: ____________ Finish time: ____________ °Date: ____________ Movements: ____________ Start time: ____________ Finish time: ____________ °Date: ____________ Movements: ____________ Start time: ____________ Finish time: ____________ °Date: ____________ Movements: ____________ Start time: ____________ Finish time: ____________ °Date: ____________ Movements: ____________ Start time: ____________ Finish time: ____________ °Date: ____________ Movements: ____________ Start time: ____________ Finish time: ____________  °Date: ____________ Movements: ____________ Start time: ____________ Finish time: ____________ °Date: ____________ Movements: ____________ Start time: ____________ Finish time: ____________ °Date: ____________ Movements: ____________ Start time: ____________ Finish time: ____________ °Date: ____________ Movements: ____________ Start time: ____________ Finish time: ____________ °Date: ____________ Movements: ____________ Start time: ____________ Finish time:  ____________ °Date: ____________ Movements: ____________ Start time: ____________ Finish time: ____________ °Date: ____________ Movements: ____________ Start time: ____________ Finish time: ____________  °Date: ____________ Movements: ____________ Start time: ____________ Finish time: ____________ °Date: ____________ Movements: ____________ Start time: ____________ Finish time: ____________ °Date: ____________ Movements: ____________ Start time: ____________ Finish time: ____________ °Date: ____________ Movements: ____________ Start time: ____________ Finish time: ____________ °Date: ____________ Movements: ____________ Start time: ____________ Finish time: ____________ °Date: ____________ Movements: ____________ Start time: ____________ Finish time: ____________ °  °This information is not intended to replace advice given to you by your health care provider. Make sure you discuss any questions you have with your health care provider. °  °Document Released: 09/25/2006 Document Revised: 09/16/2014 Document Reviewed: 06/22/2012 °Elsevier Interactive Patient Education ©2016 Elsevier Inc. ° °

## 2015-09-10 NOTE — L&D Delivery Note (Signed)
   Vaginal Delivery Note The pt utilized movement and IV pain medicine for pain management.  Artificial rupture of membranes yesterday (10/13/15), at 1400, clear.  GBS was positive, PCN x 8 doses were given.  Cervical dilation was complete at  0500.  NICHD Category 1.    Pushing with guidance began at  0500.   After 53 minutes of pushing the head, shoulders and the body of a viable female infant "Kate Sable" delivered spontaneously with maternal effort in the ROA position at 0553 . Mild shoulder, was relieved with gentle outward traction, maternal repositioning and effort, the head of the bed lowered and McRoberts.   With vigorous tone and spontaneous cry, the infant was placed on moms abd.  After the umbilical cord was clamped it was cut by the FOB, then cord blood was obtained for evaluation.  Spontaneous delivery of a intact placenta with a 3 vessel cord via Shultz at  0600.   Episiotomy: None   The vulva, perineum, vaginal vault, rectum and cervix were inspected  and revealed a First degree perineal, repaired using a 3-0 vicryl on a CT needle and a superficial bilateral periurethral laceration, the right periurethral laceration was repaired using a 4-0 vicryl on a SH needle with 8cc of 1% lidocaine, the left periurethral laceration was hemostatic and not repaired. The rectum sphincter intact after the repair.  Patient tolerated repair well.   Postpartum pitocin as ordered.  Uterus boggy immediately after delivery of placenta - fundal massage and 1000 mcg of Cytotec PR.  EBL 400, Pt hemodynamically stable.   Sponge, laps and needle count correct and verified with the primary care nurse.  Attending MD available at all times.    Routine postpartum orders   Mother unsure about method of contraception Mom plans to breastfeed      Placenta to pathology: NO      Cord Gases sent to lab: NO Cord blood sent to lab: YES   APGARS:  7 at 1 minute and 8 at 5 minutes Weight:.    Both mom and baby  were left in stable condition, baby skin to skin.      Alphonzo Severance, CNM, MSN 10/14/2015. 6:44 AM

## 2015-10-11 LAB — OB RESULTS CONSOLE GC/CHLAMYDIA
CHLAMYDIA, DNA PROBE: NEGATIVE
GC PROBE AMP, GENITAL: NEGATIVE

## 2015-10-12 ENCOUNTER — Encounter (HOSPITAL_BASED_OUTPATIENT_CLINIC_OR_DEPARTMENT_OTHER): Payer: Self-pay | Admitting: Emergency Medicine

## 2015-10-12 ENCOUNTER — Encounter (HOSPITAL_COMMUNITY): Payer: Self-pay | Admitting: *Deleted

## 2015-10-12 ENCOUNTER — Inpatient Hospital Stay (HOSPITAL_COMMUNITY)
Admission: AD | Admit: 2015-10-12 | Discharge: 2015-10-16 | DRG: 774 | Disposition: A | Discharge status: Home / Self Care | Payer: Medicaid Other | Source: Ambulatory Visit | Attending: Obstetrics and Gynecology | Admitting: Obstetrics and Gynecology

## 2015-10-12 ENCOUNTER — Emergency Department (HOSPITAL_BASED_OUTPATIENT_CLINIC_OR_DEPARTMENT_OTHER)
Admission: EM | Admit: 2015-10-12 | Discharge: 2015-10-12 | Disposition: A | Discharge status: Home / Self Care | Payer: Medicaid Other | Source: Home / Self Care | Attending: Emergency Medicine | Admitting: Emergency Medicine

## 2015-10-12 DIAGNOSIS — Z862 Personal history of diseases of the blood and blood-forming organs and certain disorders involving the immune mechanism: Secondary | ICD-10-CM

## 2015-10-12 DIAGNOSIS — R109 Unspecified abdominal pain: Secondary | ICD-10-CM | POA: Insufficient documentation

## 2015-10-12 DIAGNOSIS — O99324 Drug use complicating childbirth: Secondary | ICD-10-CM | POA: Diagnosis present

## 2015-10-12 DIAGNOSIS — Z8249 Family history of ischemic heart disease and other diseases of the circulatory system: Secondary | ICD-10-CM | POA: Diagnosis not present

## 2015-10-12 DIAGNOSIS — D649 Anemia, unspecified: Secondary | ICD-10-CM | POA: Diagnosis present

## 2015-10-12 DIAGNOSIS — F1210 Cannabis abuse, uncomplicated: Secondary | ICD-10-CM | POA: Diagnosis present

## 2015-10-12 DIAGNOSIS — Z3A36 36 weeks gestation of pregnancy: Secondary | ICD-10-CM | POA: Insufficient documentation

## 2015-10-12 DIAGNOSIS — O9989 Other specified diseases and conditions complicating pregnancy, childbirth and the puerperium: Secondary | ICD-10-CM | POA: Insufficient documentation

## 2015-10-12 DIAGNOSIS — O9832 Other infections with a predominantly sexual mode of transmission complicating childbirth: Secondary | ICD-10-CM | POA: Diagnosis present

## 2015-10-12 DIAGNOSIS — B009 Herpesviral infection, unspecified: Secondary | ICD-10-CM

## 2015-10-12 DIAGNOSIS — Z806 Family history of leukemia: Secondary | ICD-10-CM | POA: Diagnosis not present

## 2015-10-12 DIAGNOSIS — Z8619 Personal history of other infectious and parasitic diseases: Secondary | ICD-10-CM

## 2015-10-12 DIAGNOSIS — E876 Hypokalemia: Secondary | ICD-10-CM | POA: Diagnosis present

## 2015-10-12 DIAGNOSIS — O09299 Supervision of pregnancy with other poor reproductive or obstetric history, unspecified trimester: Secondary | ICD-10-CM | POA: Diagnosis present

## 2015-10-12 DIAGNOSIS — O9902 Anemia complicating childbirth: Secondary | ICD-10-CM | POA: Diagnosis present

## 2015-10-12 DIAGNOSIS — Z833 Family history of diabetes mellitus: Secondary | ICD-10-CM

## 2015-10-12 DIAGNOSIS — M545 Low back pain: Secondary | ICD-10-CM

## 2015-10-12 DIAGNOSIS — B951 Streptococcus, group B, as the cause of diseases classified elsewhere: Secondary | ICD-10-CM | POA: Diagnosis present

## 2015-10-12 DIAGNOSIS — O9081 Anemia of the puerperium: Secondary | ICD-10-CM | POA: Diagnosis present

## 2015-10-12 DIAGNOSIS — D573 Sickle-cell trait: Secondary | ICD-10-CM | POA: Diagnosis present

## 2015-10-12 DIAGNOSIS — O99824 Streptococcus B carrier state complicating childbirth: Secondary | ICD-10-CM | POA: Diagnosis present

## 2015-10-12 DIAGNOSIS — Z79899 Other long term (current) drug therapy: Secondary | ICD-10-CM | POA: Insufficient documentation

## 2015-10-12 DIAGNOSIS — A6000 Herpesviral infection of urogenital system, unspecified: Secondary | ICD-10-CM | POA: Diagnosis present

## 2015-10-12 DIAGNOSIS — Z3490 Encounter for supervision of normal pregnancy, unspecified, unspecified trimester: Secondary | ICD-10-CM

## 2015-10-12 DIAGNOSIS — O1414 Severe pre-eclampsia complicating childbirth: Secondary | ICD-10-CM | POA: Diagnosis present

## 2015-10-12 HISTORY — DX: Chlamydial infection, unspecified: A74.9

## 2015-10-12 HISTORY — DX: Herpesviral infection of urogenital system, unspecified: A60.00

## 2015-10-12 LAB — CBC
HEMATOCRIT: 31.3 % — AB (ref 36.0–46.0)
Hemoglobin: 10.8 g/dL — ABNORMAL LOW (ref 12.0–15.0)
MCH: 25.6 pg — AB (ref 26.0–34.0)
MCHC: 34.5 g/dL (ref 30.0–36.0)
MCV: 74.2 fL — AB (ref 78.0–100.0)
Platelets: 147 10*3/uL — ABNORMAL LOW (ref 150–400)
RBC: 4.22 MIL/uL (ref 3.87–5.11)
RDW: 14.8 % (ref 11.5–15.5)
WBC: 14.2 10*3/uL — AB (ref 4.0–10.5)

## 2015-10-12 LAB — COMPREHENSIVE METABOLIC PANEL
ALBUMIN: 2.7 g/dL — AB (ref 3.5–5.0)
ALK PHOS: 109 U/L (ref 38–126)
ALT: 13 U/L — ABNORMAL LOW (ref 14–54)
ANION GAP: 8 (ref 5–15)
AST: 19 U/L (ref 15–41)
BILIRUBIN TOTAL: 1 mg/dL (ref 0.3–1.2)
BUN: 5 mg/dL — ABNORMAL LOW (ref 6–20)
CALCIUM: 8.6 mg/dL — AB (ref 8.9–10.3)
CO2: 24 mmol/L (ref 22–32)
Chloride: 104 mmol/L (ref 101–111)
Creatinine, Ser: 0.68 mg/dL (ref 0.44–1.00)
GFR calc non Af Amer: >60 mL/min (ref >60)
GLUCOSE: 91 mg/dL (ref 65–99)
POTASSIUM: 3 mmol/L — AB (ref 3.5–5.1)
Sodium: 136 mmol/L (ref 135–145)
TOTAL PROTEIN: 6.9 g/dL (ref 6.5–8.1)

## 2015-10-12 LAB — URINE MICROSCOPIC-ADD ON

## 2015-10-12 LAB — URINALYSIS, ROUTINE W REFLEX MICROSCOPIC
BILIRUBIN URINE: NEGATIVE
BILIRUBIN URINE: NEGATIVE
Glucose, UA: NEGATIVE mg/dL
Glucose, UA: NEGATIVE mg/dL
KETONES UR: NEGATIVE mg/dL
Ketones, ur: NEGATIVE mg/dL
NITRITE: NEGATIVE
NITRITE: NEGATIVE
PH: 6 (ref 5.0–8.0)
Protein, ur: 30 mg/dL — AB
Protein, ur: 30 mg/dL — AB
SPECIFIC GRAVITY, URINE: 1.007 (ref 1.005–1.030)
pH: 6.5 (ref 5.0–8.0)

## 2015-10-12 LAB — LACTATE DEHYDROGENASE: LDH: 133 U/L (ref 98–192)

## 2015-10-12 LAB — OB RESULTS CONSOLE HIV ANTIBODY (ROUTINE TESTING): HIV: NONREACTIVE

## 2015-10-12 LAB — URIC ACID: Uric Acid, Serum: 4.9 mg/dL (ref 2.3–6.6)

## 2015-10-12 LAB — TYPE AND SCREEN
ABO/RH(D): O POS
ANTIBODY SCREEN: NEGATIVE

## 2015-10-12 LAB — PROTEIN / CREATININE RATIO, URINE
CREATININE, URINE: 100 mg/dL
PROTEIN CREATININE RATIO: 1.09 mg/mg{creat} — AB (ref 0.00–0.15)
TOTAL PROTEIN, URINE: 109 mg/dL

## 2015-10-12 LAB — RAPID HIV SCREEN (HIV 1/2 AB+AG)
HIV 1/2 Antibodies: NONREACTIVE
HIV-1 P24 Antigen - HIV24: NONREACTIVE

## 2015-10-12 LAB — GROUP B STREP BY PCR: GROUP B STREP BY PCR: POSITIVE — AB

## 2015-10-12 LAB — OB RESULTS CONSOLE GBS: GBS: POSITIVE

## 2015-10-12 MED ORDER — LIDOCAINE HCL (PF) 1 % IJ SOLN
30.0000 mL | INTRAMUSCULAR | Status: AC | PRN
  Administered 2015-10-14: 30 mL via SUBCUTANEOUS
  Filled 2015-10-12: qty 30

## 2015-10-12 MED ORDER — OXYTOCIN 10 UNIT/ML IJ SOLN
2.5000 [IU]/h | INTRAVENOUS | Status: DC
  Filled 2015-10-12: qty 4

## 2015-10-12 MED ORDER — PENICILLIN G POTASSIUM 5000000 UNITS IJ SOLR
5.0000 10*6.[IU] | Freq: Once | INTRAVENOUS | Status: AC
  Administered 2015-10-12: 5 10*6.[IU] via INTRAVENOUS
  Filled 2015-10-12: qty 5

## 2015-10-12 MED ORDER — OXYCODONE-ACETAMINOPHEN 5-325 MG PO TABS: 2.0000 | ORAL_TABLET | ORAL | Status: DC | PRN

## 2015-10-12 MED ORDER — LABETALOL HCL 5 MG/ML IV SOLN: 20.0000 mg | INTRAVENOUS | Status: DC | PRN

## 2015-10-12 MED ORDER — HYDRALAZINE HCL 20 MG/ML IJ SOLN: 10.0000 mg | Freq: Once | INTRAMUSCULAR | Status: DC | PRN

## 2015-10-12 MED ORDER — CALCIUM CARBONATE ANTACID 500 MG PO CHEW: 2.0000 | CHEWABLE_TABLET | ORAL | Status: DC | PRN

## 2015-10-12 MED ORDER — MAGNESIUM SULFATE 50 % IJ SOLN
2.0000 g/h | INTRAVENOUS | Status: DC
  Administered 2015-10-13 (×2): 2 g/h via INTRAVENOUS
  Filled 2015-10-12 (×2): qty 80

## 2015-10-12 MED ORDER — PENICILLIN G POTASSIUM 5000000 UNITS IJ SOLR
2.5000 10*6.[IU] | INTRAVENOUS | Status: DC
  Administered 2015-10-13 – 2015-10-14 (×7): 2.5 10*6.[IU] via INTRAVENOUS
  Filled 2015-10-12 (×10): qty 2.5

## 2015-10-12 MED ORDER — ACETAMINOPHEN 325 MG PO TABS: 650.0000 mg | ORAL_TABLET | ORAL | Status: DC | PRN

## 2015-10-12 MED ORDER — ONDANSETRON HCL 4 MG/2ML IJ SOLN: 4.0000 mg | Freq: Four times a day (QID) | INTRAMUSCULAR | Status: DC | PRN

## 2015-10-12 MED ORDER — HYDRALAZINE HCL 20 MG/ML IJ SOLN
10.0000 mg | Freq: Once | INTRAMUSCULAR | Status: DC | PRN
Start: 2015-10-12 — End: 2015-10-14

## 2015-10-12 MED ORDER — PRENATAL MULTIVITAMIN CH: 1.0000 | ORAL_TABLET | Freq: Every day | ORAL | Status: DC

## 2015-10-12 MED ORDER — POTASSIUM CHLORIDE 20 MEQ PO PACK: 20.0000 meq | PACK | Freq: Three times a day (TID) | ORAL | Status: DC

## 2015-10-12 MED ORDER — ZOLPIDEM TARTRATE 5 MG PO TABS: 5.0000 mg | ORAL_TABLET | Freq: Every evening | ORAL | Status: DC | PRN

## 2015-10-12 MED ORDER — OXYTOCIN BOLUS FROM INFUSION
500.0000 mL | INTRAVENOUS | Status: DC
  Administered 2015-10-14: 500 mL via INTRAVENOUS

## 2015-10-12 MED ORDER — CITRIC ACID-SODIUM CITRATE 334-500 MG/5ML PO SOLN: 30.0000 mL | ORAL | Status: DC | PRN

## 2015-10-12 MED ORDER — OXYTOCIN 10 UNIT/ML IJ SOLN
1.0000 m[IU]/min | INTRAVENOUS | Status: DC
  Administered 2015-10-12: 1 m[IU]/min via INTRAVENOUS
  Filled 2015-10-12: qty 4

## 2015-10-12 MED ORDER — FLEET ENEMA 7-19 GM/118ML RE ENEM: 1.0000 | ENEMA | RECTAL | Status: DC | PRN

## 2015-10-12 MED ORDER — NALBUPHINE HCL 10 MG/ML IJ SOLN
10.0000 mg | INTRAMUSCULAR | Status: DC | PRN
  Administered 2015-10-12 – 2015-10-13 (×4): 10 mg via INTRAVENOUS
  Filled 2015-10-12 (×4): qty 1

## 2015-10-12 MED ORDER — LACTATED RINGERS IV SOLN
INTRAVENOUS | Status: DC
  Administered 2015-10-12 – 2015-10-13 (×3): via INTRAVENOUS

## 2015-10-12 MED ORDER — LACTATED RINGERS IV SOLN: 500.0000 mL | INTRAVENOUS | Status: DC | PRN

## 2015-10-12 MED ORDER — TERBUTALINE SULFATE 1 MG/ML IJ SOLN: 0.2500 mg | Freq: Once | INTRAMUSCULAR | Status: DC | PRN

## 2015-10-12 MED ORDER — MAGNESIUM SULFATE BOLUS VIA INFUSION
4.0000 g | Freq: Once | INTRAVENOUS | Status: AC
  Administered 2015-10-12: 4 g via INTRAVENOUS
  Filled 2015-10-12: qty 500

## 2015-10-12 MED ORDER — POTASSIUM CHLORIDE CRYS ER 20 MEQ PO TBCR
20.0000 meq | EXTENDED_RELEASE_TABLET | Freq: Three times a day (TID) | ORAL | Status: DC
  Administered 2015-10-13 (×4): 20 meq via ORAL
  Filled 2015-10-12 (×5): qty 1

## 2015-10-12 MED ORDER — OXYCODONE-ACETAMINOPHEN 5-325 MG PO TABS: 1.0000 | ORAL_TABLET | ORAL | Status: DC | PRN

## 2015-10-12 MED ORDER — LACTATED RINGERS IV SOLN: INTRAVENOUS | Status: DC

## 2015-10-12 MED ORDER — DOCUSATE SODIUM 100 MG PO CAPS: 100.0000 mg | ORAL_CAPSULE | Freq: Every day | ORAL | Status: DC

## 2015-10-12 NOTE — MAU Note (Signed)
Pt presents to MAU with complaints of contractions. Denies any vaginal bleeding or LOF 
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
## 2015-10-12 NOTE — ED Notes (Signed)
Pt is 36 weeks and starting having abdominal pain to right side, denies vaginal discharge,

## 2015-10-12 NOTE — ED Notes (Signed)
Spoke to dr. Normand Sloop at [facility] hospital, pt is having irregular contrations but baby is stable and [facility] is [facility], spoke with kristin rapid respons

## 2015-10-12 NOTE — Discharge Instructions (Signed)

## 2015-10-12 NOTE — Progress Notes (Signed)
Received call from [facility], pt 36.1 weeks, G2P0, complaining of right side abd pain, denies bleeding, and leaking fluid. Will call RN back after looking at [facility] tracing

## 2015-10-12 NOTE — ED Notes (Signed)
Pt in nad at this time, states sharp pain in abdomen that is radiating to back has decreased since taking the ibuprofen, no vaginal discharge or spotting, pt does report fever last night but forgot what how high it was. Pt states she missed last months appt., so she was seen in office yesterday and started on valtrex in preparation for delivery. Pt denies outbreak at this time

## 2015-10-12 NOTE — ED Notes (Signed)
Pt returned to facility s/p discharge inquiring about riding in ambulance to [facility]. Patient requesting [facility], pt refused ambulance transport and wanted to go by private vehicle. After discharge pt came back into building stating she didn't have a ride and requested ambulance transport. Process was explained to patient and she was informed that it could be provided, but that there are specific things that have to be done for that to happen. Pt then refused transport and stated she would get friend to drive her, [facility] and patient was educated about importance of going to [facility]

## 2015-10-12 NOTE — ED Notes (Signed)
OB rapid response notified of patient, placed on toco

## 2015-10-12 NOTE — ED Provider Notes (Signed)
CSN: [id number]     Arrival date & time 10/12/15  1407 History   First MD Initiated Contact with Patient 10/12/15 1454     Chief Complaint  Patient presents with  . Abdominal Pain     (Consider location/radiation/quality/duration/timing/severity/associated sxs/prior Treatment) Patient is a 25 y.o. female presenting with back pain. The history is provided by the patient.  Back Pain Pain location: right lower back and flank. Quality:  Aching Radiates to:  Does not radiate Pain severity:  Moderate Pain is:  Same all the time Onset quality:  Gradual Timing:  Constant Progression:  Unchanged Chronicity:  New Context: not recent illness and not recent injury   Relieved by:  Nothing Worsened by:  Nothing tried Ineffective treatments:  None tried Associated symptoms: no abdominal pain, no bladder incontinence, no bowel incontinence, no fever, no pelvic pain and no weakness   Risk factors: pregnancy     Past Medical History  Diagnosis Date  . Sickle cell trait carrier 12/22/2012    Hgb AS  . Herpes genitalia    Past Surgical History  Procedure Laterality Date  . Dental surgery    . Dilation and curettage of uterus     Family History  Problem Relation Age of Onset  . Diabetes Maternal Grandmother   . Hypertension Maternal Grandmother   . Cancer Maternal Grandmother     leukemia  . Diabetes Maternal Grandfather   . Hypertension Maternal Grandfather    Social History  Substance Use Topics  . Smoking status: Never Smoker   . Smokeless tobacco: None  . Alcohol Use: No   OB History    Gravida Para Term Preterm AB TAB SAB Ectopic Multiple Living   2 0   1 1    0     Review of Systems  Constitutional: Negative for fever.  Gastrointestinal: Negative for abdominal pain and bowel incontinence.  Genitourinary: Negative for bladder incontinence and pelvic pain.  Musculoskeletal: Positive for back pain.  Neurological: Negative for weakness.  All other systems reviewed and are  negative.     Allergies  Review of patient's allergies indicates no known allergies.  Home Medications   Prior to Admission medications   Medication Sig Start Date End Date Taking? Authorizing Provider  acetaminophen (TYLENOL) 325 MG tablet Take 650 mg by mouth every 6 (six) hours as needed.   Yes Historical Provider, MD  valACYclovir (VALTREX) 500 MG tablet Take 500 mg by mouth 2 (two) times daily.   Yes Historical Provider, MD   BP 127/96 mmHg  Pulse 100  Temp(Src) 98.7 F (37.1 C) (Oral)  Resp 20  Ht  (1.651 m)  Wt 189 lb (85.73 kg)  BMI 31.45 kg/m2  SpO2 100%  LMP 02/01/2015 Physical Exam  Constitutional: She is oriented to person, place, and time. She appears well-developed and well-nourished. No distress.  HENT:  Head: Normocephalic.  Eyes: Conjunctivae are normal.  Neck: Neck supple. No tracheal deviation present.  Cardiovascular: Normal rate and regular rhythm.   Pulmonary/Chest: Effort normal. No respiratory distress.  Abdominal: Soft. She exhibits no distension. There is no tenderness. There is no rebound and no guarding.  Gravid abdomen  Neurological: She is alert and oriented to person, place, and time.  Skin: Skin is warm and dry.  Psychiatric: She has a normal mood and affect.    ED Course  Procedures (including critical care time)  Emergency Focused Ultrasound Exam Limited ultrasound of the pregnant uterus after first trimester.   Performed  and interpreted by Dr. Clydene Pugh Indication: pregnancy, evaluate fetal wellbeing Multiple views of the gravid uterus are obtained with a multi-frequency probe by transabdominal approach.  Findings: +FM, FHR 158 Interpretation: viable fetus Images archived electronically.  CPT Code 16109  Labs Review Labs Reviewed  URINALYSIS, ROUTINE W REFLEX MICROSCOPIC (NOT AT [facility])    Imaging Review No results found. I have personally reviewed and evaluated these images and lab results as part of my medical  decision-making.   EKG Interpretation None      MDM   Final diagnoses:  Pregnancy    25 y.o. female presents with right-sided low back pain that has been constant since last night. Dr. Normand Sloop of OB/GYN called me regarding patient and recommended transfer to [facility] after appropriate screening. Afebrile with stable vital signs on arrival, urine sent for screening and can be followed by OB. No vaginal discharge, bleeding, leakage of fluid. No symptoms of contractions currently and toco is quiet. Bedside ultrasound shows positive fetal movement and heart activity. Further [facility]up [facility] at [facility] under the care of the patient's obstetrician, she is stable to be transferred by private vehicle.  I attempted to contact the patient following the transfer to inform her of her urine results reflecting a likely UTI but there was no answer and no name on voicemail. It appears [facility] is repeating urine studies and will treat appropriately.   Lyndal Pulley, MD 10/12/15 [phone]

## 2015-10-13 ENCOUNTER — Encounter (HOSPITAL_COMMUNITY): Payer: Self-pay | Admitting: Anesthesiology

## 2015-10-13 ENCOUNTER — Encounter (HOSPITAL_COMMUNITY): Payer: Self-pay | Admitting: *Deleted

## 2015-10-13 DIAGNOSIS — B009 Herpesviral infection, unspecified: Secondary | ICD-10-CM | POA: Diagnosis present

## 2015-10-13 DIAGNOSIS — B951 Streptococcus, group B, as the cause of diseases classified elsewhere: Secondary | ICD-10-CM | POA: Diagnosis present

## 2015-10-13 LAB — COMPREHENSIVE METABOLIC PANEL
ALK PHOS: 108 U/L (ref 38–126)
ALT: 13 U/L — ABNORMAL LOW (ref 14–54)
ANION GAP: 8 (ref 5–15)
AST: 15 U/L (ref 15–41)
Albumin: 2.4 g/dL — ABNORMAL LOW (ref 3.5–5.0)
BUN: <5 mg/dL — ABNORMAL LOW (ref 6–20)
CALCIUM: 7.7 mg/dL — AB (ref 8.9–10.3)
CO2: 24 mmol/L (ref 22–32)
CREATININE: 0.56 mg/dL (ref 0.44–1.00)
Chloride: 105 mmol/L (ref 101–111)
GFR calc non Af Amer: >60 mL/min (ref >60)
GLUCOSE: 106 mg/dL — AB (ref 65–99)
POTASSIUM: 3.4 mmol/L — AB (ref 3.5–5.1)
SODIUM: 137 mmol/L (ref 135–145)
TOTAL PROTEIN: 6.7 g/dL (ref 6.5–8.1)
Total Bilirubin: 1.2 mg/dL (ref 0.3–1.2)

## 2015-10-13 LAB — INFLUENZA PANEL BY PCR (TYPE A & B)
H1N1FLUPCR: NOT DETECTED
INFLAPCR: NEGATIVE
INFLBPCR: NEGATIVE

## 2015-10-13 LAB — CBC
HEMATOCRIT: 29.6 % — AB (ref 36.0–46.0)
HEMOGLOBIN: 10.2 g/dL — AB (ref 12.0–15.0)
MCH: 25.4 pg — AB (ref 26.0–34.0)
MCHC: 34.5 g/dL (ref 30.0–36.0)
MCV: 73.8 fL — ABNORMAL LOW (ref 78.0–100.0)
Platelets: 142 10*3/uL — ABNORMAL LOW (ref 150–400)
RBC: 4.01 MIL/uL (ref 3.87–5.11)
RDW: 14.5 % (ref 11.5–15.5)
WBC: 11.9 10*3/uL — ABNORMAL HIGH (ref 4.0–10.5)

## 2015-10-13 LAB — RAPID URINE DRUG SCREEN, HOSP PERFORMED
AMPHETAMINES: NOT DETECTED
BENZODIAZEPINES: NOT DETECTED
Barbiturates: NOT DETECTED
Cocaine: NOT DETECTED
OPIATES: NOT DETECTED
Tetrahydrocannabinol: NOT DETECTED

## 2015-10-13 LAB — LACTATE DEHYDROGENASE: LDH: 130 U/L (ref 98–192)

## 2015-10-13 LAB — RPR: RPR Ser Ql: NONREACTIVE

## 2015-10-13 LAB — MAGNESIUM: Magnesium: 5.5 mg/dL — ABNORMAL HIGH (ref 1.7–2.4)

## 2015-10-13 LAB — URIC ACID: URIC ACID, SERUM: 4.4 mg/dL (ref 2.3–6.6)

## 2015-10-13 MED ORDER — PHENYLEPHRINE 40 MCG/ML (10ML) SYRINGE FOR IV PUSH (FOR BLOOD PRESSURE SUPPORT): 80.0000 ug | PREFILLED_SYRINGE | INTRAVENOUS | Status: DC | PRN

## 2015-10-13 MED ORDER — BETAMETHASONE SOD PHOS & ACET 6 (3-3) MG/ML IJ SUSP
12.0000 mg | INTRAMUSCULAR | Status: DC
  Administered 2015-10-13: 12 mg via INTRAMUSCULAR
  Filled 2015-10-13 (×2): qty 2

## 2015-10-13 MED ORDER — DIPHENHYDRAMINE HCL 50 MG/ML IJ SOLN: 12.5000 mg | INTRAMUSCULAR | Status: DC | PRN

## 2015-10-13 MED ORDER — EPHEDRINE 5 MG/ML INJ: 10.0000 mg | INTRAVENOUS | Status: DC | PRN

## 2015-10-13 MED ORDER — FENTANYL 2.5 MCG/ML BUPIVACAINE 1/10 % EPIDURAL INFUSION (WH - ANES): 14.0000 mL/h | INTRAMUSCULAR | Status: DC | PRN

## 2015-10-13 NOTE — Progress Notes (Signed)
Family at bedside.  Subjective: Denies double or blurred vision, nausea, headache, flushing, warmth, somnolence, slurred speech, weakness, CP or SOB.  Objective: BP 136/85 mmHg  Pulse 90  Temp(Src) 97.6 F (36.4 C) (Oral)  Resp 20  Ht  (1.651 m)  Wt 85.73 kg (189 lb)  BMI 31.45 kg/m2  SpO2 100%  LMP 02/01/2015 Today's Vitals   10/13/15 0130 10/13/15 0200 10/13/15 0230 10/13/15 0300  BP: 146/91 140/79 120/79 136/85  Pulse: 85 89 88 90  Temp:    97.6 F (36.4 C)  TempSrc:    Oral  Resp:      Height:      Weight:      SpO2:      PainSc:       Has not required any IV antihypertensives  Results for orders placed or performed during the hospital encounter of 10/12/15 (from the past 24 hour(s))  Urinalysis, Routine w reflex microscopic (not at [facility])     Status: Abnormal   Collection Time: 10/12/15  4:50 PM  Result Value Ref Range   Color, Urine YELLOW YELLOW   APPearance HAZY (A) CLEAR   Specific Gravity, Urine <1.005 (L) 1.005 - 1.030   pH 6.0 5.0 - 8.0   Glucose, UA NEGATIVE NEGATIVE mg/dL   Hgb urine dipstick LARGE (A) NEGATIVE   Bilirubin Urine NEGATIVE NEGATIVE   Ketones, ur NEGATIVE NEGATIVE mg/dL   Protein, ur 30 (A) NEGATIVE mg/dL   Nitrite NEGATIVE NEGATIVE   Leukocytes, UA LARGE (A) NEGATIVE  Protein / creatinine ratio, urine     Status: Abnormal   Collection Time: 10/12/15  4:50 PM  Result Value Ref Range   Creatinine, Urine 100.00 mg/dL   Total Protein, Urine 109 mg/dL   Protein Creatinine Ratio 1.09 (H) 0.00 - 0.15 mg/mg[Cre]  Urine microscopic-add on     Status: Abnormal   Collection Time: 10/12/15  4:50 PM  Result Value Ref Range   Squamous Epithelial / LPF 0-5 (A) NONE SEEN   WBC, UA 6-30 0 - 5 WBC/hpf   RBC / HPF 6-30 0 - 5 RBC/hpf   Bacteria, UA RARE (A) NONE SEEN  CBC     Status: Abnormal   Collection Time: 10/12/15  5:04 PM  Result Value Ref Range   WBC 14.2 (H) 4.0 - 10.5 K/uL   RBC 4.22 3.87 - 5.11 MIL/uL   Hemoglobin 10.8 (L) 12.0 -  15.0 g/dL   HCT 16.1 (L) 09.6 - 04.5 %   MCV 74.2 (L) 78.0 - 100.0 fL   MCH 25.6 (L) 26.0 - 34.0 pg   MCHC 34.5 30.0 - 36.0 g/dL   RDW 40.9 81.1 - 91.4 %   Platelets 147 (L) 150 - 400 K/uL  Comprehensive metabolic panel     Status: Abnormal   Collection Time: 10/12/15  5:04 PM  Result Value Ref Range   Sodium 136 135 - 145 mmol/L   Potassium 3.0 (L) 3.5 - 5.1 mmol/L   Chloride 104 101 - 111 mmol/L   CO2 24 22 - 32 mmol/L   Glucose, Bld 91 65 - 99 mg/dL   BUN 5 (L) 6 - 20 mg/dL   Creatinine, Ser 7.82 0.44 - 1.00 mg/dL   Calcium 8.6 (L) 8.9 - 10.3 mg/dL   Total Protein 6.9 6.5 - 8.1 g/dL   Albumin 2.7 (L) 3.5 - 5.0 g/dL   AST 19 15 - 41 U/L   ALT 13 (L) 14 - 54 U/L   Alkaline  Phosphatase 109 38 - 126 U/L   Total Bilirubin 1.0 0.3 - 1.2 mg/dL   GFR calc non Af Amer >60 >60 mL/min   GFR calc Af Amer >60 >60 mL/min   Anion gap 8 5 - 15  Lactate dehydrogenase     Status: None   Collection Time: 10/12/15  5:04 PM  Result Value Ref Range   LDH 133 98 - 192 U/L  Uric acid     Status: None   Collection Time: 10/12/15  5:04 PM  Result Value Ref Range   Uric Acid, Serum 4.9 2.3 - 6.6 mg/dL  Type and screen [facility]     Status: None   Collection Time: 10/12/15  6:58 PM  Result Value Ref Range   ABO/RH(D) O POS    Antibody Screen NEG    Sample Expiration 10/15/2015   Culture, blood (routine x 2)     Status: None (Preliminary result)   Collection Time: 10/12/15  6:58 PM  Result Value Ref Range   Specimen Description BLOOD RIGHT ARM    Special Requests      BOTTLES DRAWN AEROBIC AND ANAEROBIC Performed at [facility]    Culture PENDING    Report Status PENDING   RPR     Status: None   Collection Time: 10/12/15  7:59 PM  Result Value Ref Range   RPR Ser Ql Non Reactive Non Reactive  Rapid HIV screen (HIV 1/2 Ab+Ag)     Status: None   Collection Time: 10/12/15  7:59 PM  Result Value Ref Range   HIV-1 P24 Antigen - HIV24 NON REACTIVE NON  REACTIVE   HIV 1/2 Antibodies NON REACTIVE NON REACTIVE   Interpretation (HIV Ag Ab)      A non reactive test result means that HIV 1 or HIV 2 antibodies and HIV 1 p24 antigen were not detected in the specimen.  Group B strep by PCR     Status: Abnormal   Collection Time: 10/12/15 10:20 PM  Result Value Ref Range   Group B strep by PCR POSITIVE (A) NEGATIVE  OB RESULT CONSOLE Group B Strep     Status: None   Collection Time: 10/12/15 11:57 PM  Result Value Ref Range   GBS Positive   Urine rapid drug screen (hosp performed)     Status: None   Collection Time: 10/13/15 12:27 AM  Result Value Ref Range   Opiates NONE DETECTED NONE DETECTED   Cocaine NONE DETECTED NONE DETECTED   Benzodiazepines NONE DETECTED NONE DETECTED   Amphetamines NONE DETECTED NONE DETECTED   Tetrahydrocannabinol NONE DETECTED NONE DETECTED   Barbiturates NONE DETECTED NONE DETECTED  Comprehensive metabolic panel     Status: Abnormal   Collection Time: 10/13/15  5:04 AM  Result Value Ref Range   Sodium 137 135 - 145 mmol/L   Potassium 3.4 (L) 3.5 - 5.1 mmol/L   Chloride 105 101 - 111 mmol/L   CO2 24 22 - 32 mmol/L   Glucose, Bld 106 (H) 65 - 99 mg/dL   BUN <5 (L) 6 - 20 mg/dL   Creatinine, Ser 1.61 0.44 - 1.00 mg/dL   Calcium 7.7 (L) 8.9 - 10.3 mg/dL   Total Protein 6.7 6.5 - 8.1 g/dL   Albumin 2.4 (L) 3.5 - 5.0 g/dL   AST 15 15 - 41 U/L   ALT 13 (L) 14 - 54 U/L   Alkaline Phosphatase 108 38 - 126 U/L   Total Bilirubin  1.2 0.3 - 1.2 mg/dL   GFR calc non Af Amer >60 >60 mL/min   GFR calc Af Amer >60 >60 mL/min   Anion gap 8 5 - 15  Lactate dehydrogenase     Status: None   Collection Time: 10/13/15  5:04 AM  Result Value Ref Range   LDH 130 98 - 192 U/L  Uric acid     Status: None   Collection Time: 10/13/15  5:04 AM  Result Value Ref Range   Uric Acid, Serum 4.4 2.3 - 6.6 mg/dL  CBC     Status: Abnormal   Collection Time: 10/13/15  5:04 AM  Result Value Ref Range   WBC 11.9 (H) 4.0 - 10.5 K/uL    RBC 4.01 3.87 - 5.11 MIL/uL   Hemoglobin 10.2 (L) 12.0 - 15.0 g/dL   HCT 16.1 (L) 09.6 - 04.5 %   MCV 73.8 (L) 78.0 - 100.0 fL   MCH 25.4 (L) 26.0 - 34.0 pg   MCHC 34.5 30.0 - 36.0 g/dL   RDW 40.9 81.1 - 91.4 %   Platelets 142 (L) 150 - 400 K/uL    Total I/O In: -  Out: 1350 [Urine:1350] FHT: BL 120 w/ min variability, no accels, no decels - Magnesium Sulfate ongoing, received Nubain at 2324 UC:  regular, every 5 minutes SVE:   Dilation: 5.5 Effacement (%): 60 Station: -2 Exam by:: C.Okoroji RN-BSN Pitocin at 9 mU/min as of 6:30 AM BMZ given at 0535 (rescue steroids due to preterm status) Potassium level increased to 3.4  Plts now 142 Foley bulb expulsed at 6:30 AM  Assessment:  IOL due to preE w/ severe features Active phase of labor GBS positive Overall reassuring FHRT - variability decreased due to Magnesium Sulfate UDS neg  Plan: Continue induction Continue Magnesium Sulfate Continue GBS prophylaxis Dr. Charlotta Newton updated  Mayford Knife, [facility] CNM 10/13/2015, 6:36 AM

## 2015-10-13 NOTE — Progress Notes (Signed)
Subjective: Pt  Coping well with contractions, using breathing, movement and IV pain medication. Pt's mother bedside for support.   Objective: BP 129/78 mmHg  Pulse 100  Temp(Src) 97 F (36.1 C) (Axillary)  Resp 18  Ht  (1.651 m)  Wt 85.73 kg (189 lb)  BMI 31.45 kg/m2  SpO2 98%  LMP 02/04/2015 (LMP Unknown) I/O last 3 completed shifts: In: 6418.7 [P.O.:591; I.V.:5327.7; IV Piggyback:500] Out: 4700 [Urine:4700]    FHT:  Overall Category 1, bl 115 bpm, moderate variability, no accels, rare deceleration UC:   regular, every 4 minutes SVE:   Dilation: 6 Effacement (%): 80 Station: -1 Exam by:: R.Blandina Renaldo CNM IUPC present  FSE applied without difficulty at 2320  Induction: Membranes: AROM at 1400 Pitocin at 25 mu/min  Since 1731 MVus : 180  GBS prophylaxis: PCN x7 doses Pain management: Nubain x 4 doses  Assessment:  IUP at 35.6 weeks NICHD: Category 1, 120 bpm, moderate variability, +accels,  Membranes: S/p AROM x 6hrs Labor progress: IOL Pitocin Augmentation IUPC/FSE present Adequate contractions, MVus 180  GBS: positive, on PCN Mag 2gm/hr  Plan: Continue labor plan Continuous monitoring Frequent position changes to facilitate fetal rotation and descent. Will reassess with cervical exam at 0300 or earlier if necessary May have epidural PRN, cervical exam prior to epidural placement Continue pitocin per protocol  Alphonzo Severance CNM, [place] 10/13/2015, 11:47 PM

## 2015-10-13 NOTE — Progress Notes (Signed)
Labor Progress  Subjective: No problems or concerns. Just received IV pain med and wants to sleep. The family is at the bedside and wanted her to be check.  The nurse over heard the mother in law tell her to say she feels pressure and they will check you.  SP VE the mother in law was not happy the exam was the same, she stood up and said "then I guess I will just go to work then".  Later on the nurse heard the mother in law say "call me when something real is happening" and she walked out the door.    Objective: BP 122/76 mmHg  Pulse 84  Temp(Src) 96 F (35.6 C) (Axillary)  Resp 18  Ht  (1.651 m)  Wt 189 lb (85.73 kg)  BMI 31.45 kg/m2  SpO2 100%  LMP 02/01/2015 I/O last 3 completed shifts: In: -  Out: 1850 [Urine:1850] Total I/O In: 3586.8 [P.O.:221; I.V.:3165.8; IV Piggyback:200] Out: 1450 [Urine:1450] FHT: 120, minimum variability, occasional accel, one decel while pt was turning over in bed. CTX:  regular, every 3-4 minutes Uterus gravid, soft non tender SVE:  Dilation: 5.5 Effacement (%): 60 Station: -2 Exam by:: 5/60/-2 VStandard,cnm Pitocin at 81mUn/min  Assessment:  IUP at 35.5 weeks NICHD: Category 2 Membranes:  BBW Labor progress: IOL Pitocin Augmentation GBS: positive, on PCN Mag 2gm/hr  Plan: Continue labor plan Continuous monitoring Frequent position changes to facilitate fetal rotation and descent. Will reassess with cervical exam at 1400 or earlier if necessary Continue pitocin per protocol      Lincy Belles, CNM, MSN 10/13/2015. 11:06 AM

## 2015-10-13 NOTE — Progress Notes (Signed)
Pt resting.

## 2015-10-13 NOTE — Progress Notes (Signed)
Labor Progress  Subjective: No complaints, no pain associated with each ctx  Objective: BP 143/98 mmHg  Pulse 103  Temp(Src) 96 F (35.6 C) (Axillary)  Resp 18  Ht  (1.651 m)  Wt 189 lb (85.73 kg)  BMI 31.45 kg/m2  SpO2 100%  LMP 02/01/2015 I/O last 3 completed shifts: In: -  Out: 1850 [Urine:1850] Total I/O In: 4404.8 [P.O.:341; I.V.:3763.8; IV Piggyback:300] Out: 2200 [Urine:2200] FHT: 110, moderate variability, + accel, 2 minute decel down to the 50's at 1355 with return to BL CTX:  regular, every 3-4 minutes Uterus gravid, soft non tender SVE:  Dilation: 5.5 Effacement (%): 60 Station: -1 Exam by:: VStandard,cnm Pitocin at 67mUn/min   Assessment:  IUP at 35.5 weeks NICHD: Category 1 until 1355, now cat 2 Membranes: AROM at 1400 Labor progress: IOL Pitocin Augmentation GBS: positive IUPC & FSE placed  Plan: Continue labor plan Continuous monitoring Frequent position changes to facilitate fetal rotation and descent. Will recssess with cervical exam at 1700 or earlier if necessary Continue pitocin per protocol      Toyoko Silos, CNM, MSN 10/13/2015. 2:20 PM

## 2015-10-13 NOTE — Progress Notes (Signed)
Subjective: Assumed care. In room to greet patient and family. Pt coping well with contractions, states she is starting "to feel them more".   Pt desires IV pain management at this time  Objective: BP 144/101 mmHg  Pulse 96  Temp(Src) 97 F (36.1 C) (Axillary)  Resp 18  Ht  (1.651 m)  Wt 85.73 kg (189 lb)  BMI 31.45 kg/m2  SpO2 98%  LMP 02/01/2015 I/O last 3 completed shifts: In: 6418.7 [P.O.:591; I.V.:5327.7; IV Piggyback:500] Out: 4700 [Urine:4700]    Filed Vitals:   10/13/15 1813 10/13/15 1818 10/13/15 1836 10/13/15 1901  BP:   132/84 144/101  Pulse: 89 88 89 96  Temp:      TempSrc:      Resp:   18 18  Height:      Weight:      SpO2: 99% 98%      Results for orders placed or performed during the hospital encounter of 10/12/15 (from the past 24 hour(s))  Group B strep by PCR     Status: Abnormal   Collection Time: 10/12/15 10:20 PM  Result Value Ref Range   Group B strep by PCR POSITIVE (A) NEGATIVE  OB RESULT CONSOLE Group B Strep     Status: None   Collection Time: 10/12/15 11:57 PM  Result Value Ref Range   GBS Positive   Urine rapid drug screen (hosp performed)     Status: None   Collection Time: 10/13/15 12:27 AM  Result Value Ref Range   Opiates NONE DETECTED NONE DETECTED   Cocaine NONE DETECTED NONE DETECTED   Benzodiazepines NONE DETECTED NONE DETECTED   Amphetamines NONE DETECTED NONE DETECTED   Tetrahydrocannabinol NONE DETECTED NONE DETECTED   Barbiturates NONE DETECTED NONE DETECTED  Comprehensive metabolic panel     Status: Abnormal   Collection Time: 10/13/15  5:04 AM  Result Value Ref Range   Sodium 137 135 - 145 mmol/L   Potassium 3.4 (L) 3.5 - 5.1 mmol/L   Chloride 105 101 - 111 mmol/L   CO2 24 22 - 32 mmol/L   Glucose, Bld 106 (H) 65 - 99 mg/dL   BUN <5 (L) 6 - 20 mg/dL   Creatinine, Ser 1.61 0.44 - 1.00 mg/dL   Calcium 7.7 (L) 8.9 - 10.3 mg/dL   Total Protein 6.7 6.5 - 8.1 g/dL   Albumin 2.4 (L) 3.5 - 5.0 g/dL   AST 15 15 - 41  U/L   ALT 13 (L) 14 - 54 U/L   Alkaline Phosphatase 108 38 - 126 U/L   Total Bilirubin 1.2 0.3 - 1.2 mg/dL   GFR calc non Af Amer >60 >60 mL/min   GFR calc Af Amer >60 >60 mL/min   Anion gap 8 5 - 15  Lactate dehydrogenase     Status: None   Collection Time: 10/13/15  5:04 AM  Result Value Ref Range   LDH 130 98 - 192 U/L  Uric acid     Status: None   Collection Time: 10/13/15  5:04 AM  Result Value Ref Range   Uric Acid, Serum 4.4 2.3 - 6.6 mg/dL  CBC     Status: Abnormal   Collection Time: 10/13/15  5:04 AM  Result Value Ref Range   WBC 11.9 (H) 4.0 - 10.5 K/uL   RBC 4.01 3.87 - 5.11 MIL/uL   Hemoglobin 10.2 (L) 12.0 - 15.0 g/dL   HCT 09.6 (L) 04.5 - 40.9 %   MCV 73.8 (L) 78.0 -  100.0 fL   MCH 25.4 (L) 26.0 - 34.0 pg   MCHC 34.5 30.0 - 36.0 g/dL   RDW 16.1 09.6 - 04.5 %   Platelets 142 (L) 150 - 400 K/uL  Influenza panel by PCR (type A & B, H1N1)     Status: None   Collection Time: 10/13/15  6:52 AM  Result Value Ref Range   Influenza A By PCR NEGATIVE NEGATIVE   Influenza B By PCR NEGATIVE NEGATIVE   H1N1 flu by pcr NOT DETECTED NOT DETECTED  Magnesium     Status: Abnormal   Collection Time: 10/13/15 12:26 PM  Result Value Ref Range   Magnesium 5.5 (H) 1.7 - 2.4 mg/dL    FHT: Category 1, 409 bpm, moderate variability, +accels,  UC:   regular, every 2-3 minutes;  Palpate mild  SVE:   Dilation: 5 Effacement (%): 80 Station: -1 Exam by:: R.Jorden Mahl CNM IUPC  present  Induction: Membranes: AROM at 1400 Pitocin at  25 mu/min MVus : 200  GBS prophylaxis:  PCN x6 doses Pain management:  Nubain x 2 doses   Assessment:  IUP at 35.5 weeks NICHD: Category 1, 120 bpm, moderate variability, +accels,  Membranes: S/p AROM x 6hrs Labor progress: IOL Pitocin Augmentation Adequate contractions, MVus 200   GBS: positive, on PCN Mag 2gm/hr  Plan: Continue labor plan Continuous monitoring Frequent position changes to facilitate fetal rotation and descent. Will  reassess with cervical exam at 2300 or earlier if necessary May have epidural PRN, cervical exam prior to epidural placement Continue pitocin per protocol    Alphonzo Severance CNM, [place] 10/13/2015, 7:54 PM

## 2015-10-13 NOTE — Progress Notes (Addendum)
  Subjective: Reports resolution of h/a. States back pain less and she no longer feels feverish. Reports sinus pressure in maxillary region. Denies visual changes, epigastric pain or difficulty breathing.   Increased pain shortly after intracervical balloon placed. Was unable to finish regular meal due to the pain.  Objective: BP 112/60 mmHg  Pulse 87  Temp(Src) 99.4 F (37.4 C) (Oral)  Resp 20  Ht  (1.651 m)  Wt 85.73 kg (189 lb)  BMI 31.45 kg/m2  SpO2 98%  LMP 02/01/2015   BP range since admission = 123-171/67-102 Has not required IV antihypertensives  Tender maxillary sinuses - no foul smelling nasal or pharyngeal d/c FHT: BL FHR 140 w/ min variability, no accels, no decels -- recent Nubain UC:   irregular, every 1-3 minutes SVE: Deferred     Intracervical ([employer]) balloon placed blindly w/ stylet at 9:52 PM w/o incident  Pitocin started at 1 mU/min at 2337 Magnesium Sulfate 4 g bolus started at 2339, 2 g continuous at midnight Nubain 10 mg at 2324 PCN #1 given at 2337    Assessment:  IOL due to preE w/ severe features Hypokalemia Maxillary sinusitis GBS positive Low platelets (147)   Plan: Await FB extrusion Potassium po supplementation Continue current plan Consult prn  Sherre Scarlet CNM 10/13/2015, 12:33 AM

## 2015-10-14 ENCOUNTER — Encounter (HOSPITAL_COMMUNITY): Payer: Self-pay | Admitting: *Deleted

## 2015-10-14 LAB — CBC
HCT: 26.9 % — ABNORMAL LOW (ref 36.0–46.0)
HEMOGLOBIN: 9.1 g/dL — AB (ref 12.0–15.0)
MCH: 25.2 pg — AB (ref 26.0–34.0)
MCHC: 33.8 g/dL (ref 30.0–36.0)
MCV: 74.5 fL — AB (ref 78.0–100.0)
Platelets: 122 10*3/uL — ABNORMAL LOW (ref 150–400)
RBC: 3.61 MIL/uL — AB (ref 3.87–5.11)
RDW: 14.8 % (ref 11.5–15.5)
WBC: 14.8 10*3/uL — ABNORMAL HIGH (ref 4.0–10.5)

## 2015-10-14 LAB — COMPREHENSIVE METABOLIC PANEL
ALBUMIN: 2.3 g/dL — AB (ref 3.5–5.0)
ALT: 15 U/L (ref 14–54)
AST: 27 U/L (ref 15–41)
Alkaline Phosphatase: 98 U/L (ref 38–126)
Anion gap: 10 (ref 5–15)
BILIRUBIN TOTAL: 0.9 mg/dL (ref 0.3–1.2)
CHLORIDE: 105 mmol/L (ref 101–111)
CO2: 23 mmol/L (ref 22–32)
CREATININE: 0.66 mg/dL (ref 0.44–1.00)
Calcium: 6.8 mg/dL — ABNORMAL LOW (ref 8.9–10.3)
GFR calc Af Amer: >60 mL/min (ref >60)
GLUCOSE: 111 mg/dL — AB (ref 65–99)
POTASSIUM: 3.3 mmol/L — AB (ref 3.5–5.1)
Sodium: 138 mmol/L (ref 135–145)
Total Protein: 6.3 g/dL — ABNORMAL LOW (ref 6.5–8.1)

## 2015-10-14 LAB — URINE CULTURE: Culture: 20000

## 2015-10-14 LAB — URIC ACID: Uric Acid, Serum: 4 mg/dL (ref 2.3–6.6)

## 2015-10-14 LAB — LACTATE DEHYDROGENASE: LDH: 223 U/L — ABNORMAL HIGH (ref 98–192)

## 2015-10-14 LAB — MAGNESIUM: Magnesium: 5.4 mg/dL — ABNORMAL HIGH (ref 1.7–2.4)

## 2015-10-14 MED ORDER — SENNOSIDES-DOCUSATE SODIUM 8.6-50 MG PO TABS
2.0000 | ORAL_TABLET | ORAL | Status: DC
  Administered 2015-10-14 – 2015-10-15 (×2): 2 via ORAL
  Filled 2015-10-14 (×2): qty 2

## 2015-10-14 MED ORDER — BENZOCAINE-MENTHOL 20-0.5 % EX AERO
1.0000 | INHALATION_SPRAY | CUTANEOUS | Status: DC | PRN
Start: 2015-10-14 — End: 2015-10-16
  Filled 2015-10-14 (×2): qty 56

## 2015-10-14 MED ORDER — LACTATED RINGERS IV SOLN
INTRAVENOUS | Status: DC
  Administered 2015-10-14 – 2015-10-15 (×2): via INTRAVENOUS

## 2015-10-14 MED ORDER — ONDANSETRON HCL 4 MG PO TABS: 4.0000 mg | ORAL_TABLET | ORAL | Status: DC | PRN

## 2015-10-14 MED ORDER — ACETAMINOPHEN 325 MG PO TABS: 650.0000 mg | ORAL_TABLET | ORAL | Status: DC | PRN

## 2015-10-14 MED ORDER — MAGNESIUM SULFATE 50 % IJ SOLN
2.0000 g/h | INTRAVENOUS | Status: AC
  Administered 2015-10-14 – 2015-10-15 (×2): 2 g/h via INTRAVENOUS
  Filled 2015-10-14 (×2): qty 80

## 2015-10-14 MED ORDER — OXYCODONE-ACETAMINOPHEN 5-325 MG PO TABS: 1.0000 | ORAL_TABLET | ORAL | Status: DC | PRN

## 2015-10-14 MED ORDER — ZOLPIDEM TARTRATE 5 MG PO TABS: 5.0000 mg | ORAL_TABLET | Freq: Every evening | ORAL | Status: DC | PRN

## 2015-10-14 MED ORDER — DIBUCAINE 1 % RE OINT
1.0000 "application " | TOPICAL_OINTMENT | RECTAL | Status: DC | PRN
  Filled 2015-10-14: qty 28

## 2015-10-14 MED ORDER — IBUPROFEN 600 MG PO TABS
600.0000 mg | ORAL_TABLET | Freq: Four times a day (QID) | ORAL | Status: DC
  Administered 2015-10-14 – 2015-10-16 (×11): 600 mg via ORAL
  Filled 2015-10-14 (×11): qty 1

## 2015-10-14 MED ORDER — TETANUS-DIPHTH-ACELL PERTUSSIS 5-2.5-18.5 LF-MCG/0.5 IM SUSP
0.5000 mL | Freq: Once | INTRAMUSCULAR | Status: DC
  Filled 2015-10-14: qty 0.5

## 2015-10-14 MED ORDER — MISOPROSTOL 200 MCG PO TABS
ORAL_TABLET | ORAL | Status: AC
  Administered 2015-10-14: 1000 ug via RECTAL
  Filled 2015-10-14: qty 5

## 2015-10-14 MED ORDER — LANOLIN HYDROUS EX OINT
TOPICAL_OINTMENT | CUTANEOUS | Status: DC | PRN
Start: 2015-10-14 — End: 2015-10-16

## 2015-10-14 MED ORDER — WITCH HAZEL-GLYCERIN EX PADS: 1.0000 "application " | MEDICATED_PAD | CUTANEOUS | Status: DC | PRN

## 2015-10-14 MED ORDER — OXYCODONE-ACETAMINOPHEN 5-325 MG PO TABS: 2.0000 | ORAL_TABLET | ORAL | Status: DC | PRN

## 2015-10-14 MED ORDER — DIPHENHYDRAMINE HCL 25 MG PO CAPS: 25.0000 mg | ORAL_CAPSULE | Freq: Four times a day (QID) | ORAL | Status: DC | PRN

## 2015-10-14 MED ORDER — SIMETHICONE 80 MG PO CHEW
80.0000 mg | CHEWABLE_TABLET | ORAL | Status: DC | PRN
  Administered 2015-10-14: 80 mg via ORAL

## 2015-10-14 MED ORDER — PRENATAL MULTIVITAMIN CH
1.0000 | ORAL_TABLET | Freq: Every day | ORAL | Status: DC
  Administered 2015-10-14 – 2015-10-16 (×3): 1 via ORAL
  Filled 2015-10-14 (×3): qty 1

## 2015-10-14 MED ORDER — FERROUS SULFATE 325 (65 FE) MG PO TABS
325.0000 mg | ORAL_TABLET | Freq: Two times a day (BID) | ORAL | Status: DC
  Administered 2015-10-14 – 2015-10-16 (×6): 325 mg via ORAL
  Filled 2015-10-14 (×9): qty 1

## 2015-10-14 MED ORDER — MISOPROSTOL 200 MCG PO TABS
1000.0000 ug | ORAL_TABLET | Freq: Once | ORAL | Status: AC
  Administered 2015-10-14: 1000 ug via RECTAL

## 2015-10-14 MED ORDER — ONDANSETRON HCL 4 MG/2ML IJ SOLN: 4.0000 mg | INTRAMUSCULAR | Status: DC | PRN

## 2015-10-14 NOTE — Progress Notes (Signed)
Subjective: Feeling the urge to push  Objective: BP 148/102 mmHg  Pulse 101  Temp(Src) 97 F (36.1 C) (Axillary)  Resp 18  Ht  (1.651 m)  Wt 85.73 kg (189 lb)  BMI 31.45 kg/m2  SpO2 98%  LMP 02/04/2015 (LMP Unknown) I/O last 3 completed shifts: In: 6418.7 [P.O.:591; I.V.:5327.7; IV Piggyback:500] Out: 4700 [Urine:4700]    FHT: Category 1, 120 bpm, moderate variability, no accels, no decels UC:   regular, every 2-4 minutes SVE:   10/100/+2 FSE present   Induction: Membranes: AROM at 1400 Pitocin at 25 mu/min Since 1731  GBS prophylaxis: PCN x8 doses Pain management: Nubain x 4 doses  Assessment:  IUP at 36.0 weeks NICHD: Category 1, 120 bpm, moderate variability, no accels, no decels Membranes: S/p AROM x 14.5 hrs Labor progress: IOL  Pitocin Augmentation Second Stage  FSE present GBS: positive, on PCN Mag 2gm/hr  Plan: Continue labor plan Continuous monitoring Anticipate SVD  Alphonzo Severance CNM, [place] 10/14/2015, 5:10 AM

## 2015-10-14 NOTE — Progress Notes (Addendum)
Subjective: Feeling increased pressure and back pain.   Objective: BP 129/78 mmHg  Pulse 100  Temp(Src) 97 F (36.1 C) (Axillary)  Resp 18  Ht  (1.651 m)  Wt 85.73 kg (189 lb)  BMI 31.45 kg/m2  SpO2 98%  LMP 02/04/2015 (LMP Unknown) I/O last 3 completed shifts: In: 6418.7 [P.O.:591; I.V.:5327.7; IV Piggyback:500] Out: 4700 [Urine:4700]    FHT: Category 1, 110 bpm, moderate variability, no accels, no decels UC:   regular, every 2-4 minutes SVE:   Dilation: Lip/rim Effacement (%): 100 Station: 0, +1 Exam by:: R.Monserrath Junio CNM IUPC/FSE present    Induction: Membranes: AROM at 1400 Pitocin at 25 mu/min Since 1731 MVus : 180  GBS prophylaxis: PCN x7 doses Pain management: Nubain x 4 doses    Assessment:  IUP at 36.0 weeks NICHD: Category 1, 110 bpm, moderate variability, no accels, no decels Membranes: S/p AROM x 11 hrs Labor progress: IOL Pitocin Augmentation IUPC/FSE present Adequate contractions, MVus 180 GBS: positive, on PCN Mag 2gm/hr  Plan: High Fowlers Continue labor plan Continuous monitoring Continue pitocin per protocol Will begin pushing when pt begins to feel urge to push Anticipate SVD  Alphonzo Severance CNM, [place] 10/14/2015, 1:17 AM

## 2015-10-14 NOTE — Progress Notes (Signed)
Post Partum Day 0 Subjective: no complaints, up ad lib, voiding and tolerating PO.  No HA, BLURRED VISION OR RUQ PAIN  Objective: Blood pressure 142/92, pulse 98, temperature 98.6 F (37 C), temperature source Oral, resp. rate 20, height '5\' 5"'  (1.651 m), weight 189 lb (85.73 kg), last menstrual period 02/04/2015, SpO2 98 %, unknown if currently breastfeeding.  Physical Exam:  General: alert and cooperative  CV RRR LUNGS CTAB Lochia: appropriate Uterine Fundus: firm Incision: na DVT Evaluation: No evidence of DVT seen on physical exam.   Recent Labs  10/13/15 0504 10/14/15 1233  HGB 10.2* 9.1*  HCT 29.6* 26.9*   Recent Results (from the past 2160 hour(s))  Type and screen     Status: None   Collection Time: 09/01/15  1:03 AM  Result Value Ref Range   ABO/RH(D) O POS    Antibody Screen NEG    Sample Expiration 09/04/2015   ABO/Rh     Status: None   Collection Time: 09/01/15  1:03 AM  Result Value Ref Range   ABO/RH(D) O POS   OB RESULTS CONSOLE GC/Chlamydia     Status: None   Collection Time: 10/11/15 12:00 AM  Result Value Ref Range   Gonorrhea Negative    Chlamydia Negative   OB RESULTS CONSOLE HIV antibody     Status: None   Collection Time: 10/12/15 12:00 AM  Result Value Ref Range   HIV Non-reactive   Urinalysis, Routine w reflex microscopic (not at [facility])     Status: Abnormal   Collection Time: 10/12/15  2:30 PM  Result Value Ref Range   Color, Urine YELLOW YELLOW   APPearance CLOUDY (A) CLEAR   Specific Gravity, Urine 1.007 1.005 - 1.030   pH 6.5 5.0 - 8.0   Glucose, UA NEGATIVE NEGATIVE mg/dL   Hgb urine dipstick LARGE (A) NEGATIVE   Bilirubin Urine NEGATIVE NEGATIVE   Ketones, ur NEGATIVE NEGATIVE mg/dL   Protein, ur 30 (A) NEGATIVE mg/dL   Nitrite NEGATIVE NEGATIVE   Leukocytes, UA LARGE (A) NEGATIVE  Urine microscopic-add on     Status: Abnormal   Collection Time: 10/12/15  2:30 PM  Result Value Ref Range   Squamous Epithelial / LPF 0-5 (A) NONE  SEEN   WBC, UA 6-30 0 - 5 WBC/hpf   RBC / HPF 6-30 0 - 5 RBC/hpf   Bacteria, UA MANY (A) NONE SEEN  Urinalysis, Routine w reflex microscopic (not at [facility])     Status: Abnormal   Collection Time: 10/12/15  4:50 PM  Result Value Ref Range   Color, Urine YELLOW YELLOW   APPearance HAZY (A) CLEAR   Specific Gravity, Urine <1.005 (L) 1.005 - 1.030   pH 6.0 5.0 - 8.0   Glucose, UA NEGATIVE NEGATIVE mg/dL   Hgb urine dipstick LARGE (A) NEGATIVE   Bilirubin Urine NEGATIVE NEGATIVE   Ketones, ur NEGATIVE NEGATIVE mg/dL   Protein, ur 30 (A) NEGATIVE mg/dL   Nitrite NEGATIVE NEGATIVE   Leukocytes, UA LARGE (A) NEGATIVE  Protein / creatinine ratio, urine     Status: Abnormal   Collection Time: 10/12/15  4:50 PM  Result Value Ref Range   Creatinine, Urine 100.00 mg/dL   Total Protein, Urine 109 mg/dL    Comment: NO NORMAL RANGE ESTABLISHED FOR THIS TEST   Protein Creatinine Ratio 1.09 (H) 0.00 - 0.15 mg/mg[Cre]  Urine microscopic-add on     Status: Abnormal   Collection Time: 10/12/15  4:50 PM  Result Value Ref Range  Squamous Epithelial / LPF 0-5 (A) NONE SEEN   WBC, UA 6-30 0 - 5 WBC/hpf   RBC / HPF 6-30 0 - 5 RBC/hpf   Bacteria, UA RARE (A) NONE SEEN  CBC     Status: Abnormal   Collection Time: 10/12/15  5:04 PM  Result Value Ref Range   WBC 14.2 (H) 4.0 - 10.5 K/uL   RBC 4.22 3.87 - 5.11 MIL/uL   Hemoglobin 10.8 (L) 12.0 - 15.0 g/dL   HCT 31.3 (L) 36.0 - 46.0 %   MCV 74.2 (L) 78.0 - 100.0 fL   MCH 25.6 (L) 26.0 - 34.0 pg   MCHC 34.5 30.0 - 36.0 g/dL   RDW 14.8 11.5 - 15.5 %   Platelets 147 (L) 150 - 400 K/uL    Comment: REPEATED TO VERIFY SPECIMEN CHECKED FOR CLOTS   Comprehensive metabolic panel     Status: Abnormal   Collection Time: 10/12/15  5:04 PM  Result Value Ref Range   Sodium 136 135 - 145 mmol/L   Potassium 3.0 (L) 3.5 - 5.1 mmol/L   Chloride 104 101 - 111 mmol/L   CO2 24 22 - 32 mmol/L   Glucose, Bld 91 65 - 99 mg/dL   BUN 5 (L) 6 - 20 mg/dL   Creatinine,  Ser 0.68 0.44 - 1.00 mg/dL   Calcium 8.6 (L) 8.9 - 10.3 mg/dL   Total Protein 6.9 6.5 - 8.1 g/dL   Albumin 2.7 (L) 3.5 - 5.0 g/dL   AST 19 15 - 41 U/L   ALT 13 (L) 14 - 54 U/L   Alkaline Phosphatase 109 38 - 126 U/L   Total Bilirubin 1.0 0.3 - 1.2 mg/dL   GFR calc non Af Amer >60 >60 mL/min   GFR calc Af Amer >60 >60 mL/min    Comment: (NOTE) The eGFR has been calculated using the CKD EPI equation. This calculation has not been validated in all clinical situations. eGFR's persistently <60 mL/min signify possible Chronic Kidney Disease.    Anion gap 8 5 - 15  Lactate dehydrogenase     Status: None   Collection Time: 10/12/15  5:04 PM  Result Value Ref Range   LDH 133 98 - 192 U/L  Uric acid     Status: None   Collection Time: 10/12/15  5:04 PM  Result Value Ref Range   Uric Acid, Serum 4.9 2.3 - 6.6 mg/dL  Type and screen [place]     Status: None   Collection Time: 10/12/15  6:58 PM  Result Value Ref Range   ABO/RH(D) O POS    Antibody Screen NEG    Sample Expiration 10/15/2015   Culture, blood (routine x 2)     Status: None (Preliminary result)   Collection Time: 10/12/15  6:58 PM  Result Value Ref Range   Specimen Description BLOOD RIGHT ARM    Special Requests BOTTLES DRAWN AEROBIC AND ANAEROBIC 10ML    Culture      NO GROWTH 2 DAYS Performed at [facility]    Report Status PENDING   RPR     Status: None   Collection Time: 10/12/15  7:59 PM  Result Value Ref Range   RPR Ser Ql Non Reactive Non Reactive    Comment: (NOTE) Performed At: [facility] [address] [place], [place] [id number] Lindon Romp MD VO:[id number]   Rapid HIV screen (HIV 1/2 Ab+Ag)     Status: None   Collection Time:  10/12/15  7:59 PM  Result Value Ref Range   HIV-1 P24 Antigen - HIV24 NON REACTIVE NON REACTIVE   HIV 1/2 Antibodies NON REACTIVE NON REACTIVE   Interpretation (HIV Ag Ab)      A non reactive test result means that HIV 1 or HIV 2  antibodies and HIV 1 p24 antigen were not detected in the specimen.  Group B strep by PCR     Status: Abnormal   Collection Time: 10/12/15 10:20 PM  Result Value Ref Range   Group B strep by PCR POSITIVE (A) NEGATIVE    Comment: CRITICAL RESULT CALLED TO, READ BACK BY AND VERIFIED WITH: OKOROGI,C. AT 2354 ON 10/12/2015 BY HOUEGNIFIO M   OB RESULT CONSOLE Group B Strep     Status: None   Collection Time: 10/12/15 11:57 PM  Result Value Ref Range   GBS Positive   Urine culture     Status: None   Collection Time: 10/13/15 12:27 AM  Result Value Ref Range   Specimen Description URINE, CLEAN CATCH    Special Requests NONE    Culture      20,000 COLONIES/mL GROUP B STREP(S.AGALACTIAE)ISOLATED TESTING AGAINST S. AGALACTIAE NOT ROUTINELY PERFORMED DUE TO PREDICTABILITY OF AMP/PEN/VAN SUSCEPTIBILITY. Performed at [facility]    Report Status 10/14/2015 FINAL   Urine rapid drug screen (hosp performed)     Status: None   Collection Time: 10/13/15 12:27 AM  Result Value Ref Range   Opiates NONE DETECTED NONE DETECTED   Cocaine NONE DETECTED NONE DETECTED   Benzodiazepines NONE DETECTED NONE DETECTED   Amphetamines NONE DETECTED NONE DETECTED   Tetrahydrocannabinol NONE DETECTED NONE DETECTED   Barbiturates NONE DETECTED NONE DETECTED    Comment:        DRUG SCREEN FOR MEDICAL PURPOSES ONLY.  IF CONFIRMATION IS NEEDED FOR ANY PURPOSE, NOTIFY LAB WITHIN 5 DAYS.        LOWEST DETECTABLE LIMITS FOR URINE DRUG SCREEN Drug Class       Cutoff (ng/mL) Amphetamine      1000 Barbiturate      200 Benzodiazepine   865 Tricyclics       784 Opiates          300 Cocaine          300 THC              50   Comprehensive metabolic panel     Status: Abnormal   Collection Time: 10/13/15  5:04 AM  Result Value Ref Range   Sodium 137 135 - 145 mmol/L   Potassium 3.4 (L) 3.5 - 5.1 mmol/L   Chloride 105 101 - 111 mmol/L   CO2 24 22 - 32 mmol/L   Glucose, Bld 106 (H) 65 - 99 mg/dL   BUN <5  (L) 6 - 20 mg/dL    Comment: REPEATED TO VERIFY   Creatinine, Ser 0.56 0.44 - 1.00 mg/dL   Calcium 7.7 (L) 8.9 - 10.3 mg/dL   Total Protein 6.7 6.5 - 8.1 g/dL   Albumin 2.4 (L) 3.5 - 5.0 g/dL   AST 15 15 - 41 U/L   ALT 13 (L) 14 - 54 U/L   Alkaline Phosphatase 108 38 - 126 U/L   Total Bilirubin 1.2 0.3 - 1.2 mg/dL   GFR calc non Af Amer >60 >60 mL/min   GFR calc Af Amer >60 >60 mL/min    Comment: (NOTE) The eGFR has been calculated using the CKD EPI equation. This  calculation has not been validated in all clinical situations. eGFR's persistently <60 mL/min signify possible Chronic Kidney Disease.    Anion gap 8 5 - 15  Lactate dehydrogenase     Status: None   Collection Time: 10/13/15  5:04 AM  Result Value Ref Range   LDH 130 98 - 192 U/L  Uric acid     Status: None   Collection Time: 10/13/15  5:04 AM  Result Value Ref Range   Uric Acid, Serum 4.4 2.3 - 6.6 mg/dL  CBC     Status: Abnormal   Collection Time: 10/13/15  5:04 AM  Result Value Ref Range   WBC 11.9 (H) 4.0 - 10.5 K/uL   RBC 4.01 3.87 - 5.11 MIL/uL   Hemoglobin 10.2 (L) 12.0 - 15.0 g/dL   HCT 29.6 (L) 36.0 - 46.0 %   MCV 73.8 (L) 78.0 - 100.0 fL   MCH 25.4 (L) 26.0 - 34.0 pg   MCHC 34.5 30.0 - 36.0 g/dL   RDW 14.5 11.5 - 15.5 %   Platelets 142 (L) 150 - 400 K/uL    Comment: SPECIMEN CHECKED FOR CLOTS REPEATED TO VERIFY   Influenza panel by PCR (type A & B, H1N1)     Status: None   Collection Time: 10/13/15  6:52 AM  Result Value Ref Range   Influenza A By PCR NEGATIVE NEGATIVE   Influenza B By PCR NEGATIVE NEGATIVE   H1N1 flu by pcr NOT DETECTED NOT DETECTED    Comment:        The Xpert Flu assay (FDA approved for nasal aspirates or washes and nasopharyngeal swab specimens), is intended as an aid in the diagnosis of influenza and should not be used as a sole basis for treatment. Performed at [facility]   Magnesium     Status: Abnormal   Collection Time: 10/13/15 12:26 PM  Result Value  Ref Range   Magnesium 5.5 (H) 1.7 - 2.4 mg/dL  Comprehensive metabolic panel     Status: Abnormal (Preliminary result)   Collection Time: 10/14/15 12:33 PM  Result Value Ref Range   Sodium 138 135 - 145 mmol/L   Potassium 3.3 (L) 3.5 - 5.1 mmol/L   Chloride 105 101 - 111 mmol/L   CO2 23 22 - 32 mmol/L   Glucose, Bld 111 (H) 65 - 99 mg/dL   BUN PENDING 6 - 20 mg/dL   Creatinine, Ser 0.66 0.44 - 1.00 mg/dL   Calcium 6.8 (L) 8.9 - 10.3 mg/dL   Total Protein 6.3 (L) 6.5 - 8.1 g/dL   Albumin 2.3 (L) 3.5 - 5.0 g/dL   AST 27 15 - 41 U/L   ALT 15 14 - 54 U/L   Alkaline Phosphatase 98 38 - 126 U/L   Total Bilirubin 0.9 0.3 - 1.2 mg/dL   GFR calc non Af Amer >60 >60 mL/min   GFR calc Af Amer >60 >60 mL/min    Comment: (NOTE) The eGFR has been calculated using the CKD EPI equation. This calculation has not been validated in all clinical situations. eGFR's persistently <60 mL/min signify possible Chronic Kidney Disease.    Anion gap 10 5 - 15  Lactate dehydrogenase     Status: Abnormal   Collection Time: 10/14/15 12:33 PM  Result Value Ref Range   LDH 223 (H) 98 - 192 U/L  Uric acid     Status: None   Collection Time: 10/14/15 12:33 PM  Result Value Ref Range   Uric  Acid, Serum 4.0 2.3 - 6.6 mg/dL  CBC     Status: Abnormal   Collection Time: 10/14/15 12:33 PM  Result Value Ref Range   WBC 14.8 (H) 4.0 - 10.5 K/uL   RBC 3.61 (L) 3.87 - 5.11 MIL/uL   Hemoglobin 9.1 (L) 12.0 - 15.0 g/dL   HCT 26.9 (L) 36.0 - 46.0 %   MCV 74.5 (L) 78.0 - 100.0 fL   MCH 25.2 (L) 26.0 - 34.0 pg   MCHC 33.8 30.0 - 36.0 g/dL   RDW 14.8 11.5 - 15.5 %   Platelets 122 (L) 150 - 400 K/uL    Comment: SPECIMEN CHECKED FOR CLOTS REPEATED TO VERIFY   Magnesium     Status: Abnormal   Collection Time: 10/14/15 12:33 PM  Result Value Ref Range   Magnesium 5.4 (H) 1.7 - 2.4 mg/dL     Assessment/Plan PREECLAMPSIA PT STABLE CONTINUE MAGNESIUM AND CARE   LOS: 2 days   Rajah Lamba A 10/14/2015, 1:41 PM

## 2015-10-14 NOTE — Progress Notes (Addendum)
Subjective: Tired, reports contractions pain mostly in her back.   Objective: BP 148/102 mmHg  Pulse 101  Temp(Src) 97 F (36.1 C) (Axillary)  Resp 18  Ht  (1.651 m)  Wt 85.73 kg (189 lb)  BMI 31.45 kg/m2  SpO2 98%  LMP 02/04/2015 (LMP Unknown) I/O last 3 completed shifts: In: 6418.7 [P.O.:591; I.V.:5327.7; IV Piggyback:500] Out: 4700 [Urine:4700]    FHT: Category 1, BL 115 bpm, moderate variability, no accels, no decels UC:   regular, every 3-4 minutes SVE:   Dilation: Lip/rim Effacement (%): 100 Station: 0 Exam by:: R.Jurgen Groeneveld FSE present  Initial IUPC dislodged, attempted to insert replacement IUPC, unable to advance due to fetal position   Induction: Membranes: AROM at 1400 Pitocin at 25 mu/min Since 1731  GBS prophylaxis: PCN x7 doses Pain management: Nubain x 4 doses   Assessment:  IUP at 36.0 weeks NICHD: Category 1, 110 bpm, moderate variability, no accels, no decels Membranes: S/p AROM x 13.5 hrs Labor progress: IOL  Pitocin Augmentation Transitional labor SVE: Thick anterior lip/100/0 FSE present GBS: positive, on PCN Mag 2gm/hr  Plan: High Fowlers and frequent position changes Continue labor plan Continuous monitoring Continue pitocin per protocol Will begin pushing when pt begins to feel urge to push Anticipate SVD  Alphonzo Severance CNM, [place] 10/14/2015, 3:20 AM

## 2015-10-14 NOTE — Progress Notes (Addendum)
Subjective: Called to room, beginning to feel more pressure  Objective: BP 148/102 mmHg  Pulse 101  Temp(Src) 97 F (36.1 C) (Axillary)  Resp 18  Ht  (1.651 m)  Wt 85.73 kg (189 lb)  BMI 31.45 kg/m2  SpO2 98%  LMP 02/04/2015 (LMP Unknown) I/O last 3 completed shifts: In: 6418.7 [P.O.:591; I.V.:5327.7; IV Piggyback:500] Out: 4700 [Urine:4700]    FHT: Category 2, BL 115, minimal/moderate variability, accels, rare deceleration UC:   regular, every 2-3 minutes SVE:   Dilation: Lip/rim Effacement (%): 100 Station: 0, +1 Exam by:: R.Ellianna Ruest CNM FSE present   Induction: Membranes: AROM at 1400 Pitocin at 25 mu/min Since 1731  GBS prophylaxis: PCN x8 doses Pain management: Nubain x 4 doses  Assessment:  IUP at 36.0 weeks NICHD: Category 2, 110 bpm, minimal-moderate variability, no accels, no decels Membranes: S/p AROM x 14.5 hrs Labor progress: IOL  Pitocin Augmentation Transitional labor SVE: Thin anterior lip/100/0 FSE present GBS: positive, on PCN Mag 2gm/hr  Plan: Frequent position changes Continue labor plan Continuous monitoring Continue pitocin per protocol Will begin pushing when pt complete & begins to feel stronger urge to push Anticipate SVD  Alphonzo Severance CNM, [place] 10/14/2015, 4:43 AM

## 2015-10-15 LAB — COMPREHENSIVE METABOLIC PANEL
ALBUMIN: 2.1 g/dL — AB (ref 3.5–5.0)
ALT: 16 U/L (ref 14–54)
ANION GAP: 8 (ref 5–15)
AST: 22 U/L (ref 15–41)
Alkaline Phosphatase: 83 U/L (ref 38–126)
CHLORIDE: 104 mmol/L (ref 101–111)
CO2: 27 mmol/L (ref 22–32)
Calcium: 6.6 mg/dL — ABNORMAL LOW (ref 8.9–10.3)
Creatinine, Ser: 0.67 mg/dL (ref 0.44–1.00)
GFR calc Af Amer: >60 mL/min (ref >60)
GLUCOSE: 91 mg/dL (ref 65–99)
POTASSIUM: 3.4 mmol/L — AB (ref 3.5–5.1)
Sodium: 139 mmol/L (ref 135–145)
Total Bilirubin: 0.7 mg/dL (ref 0.3–1.2)
Total Protein: 5.7 g/dL — ABNORMAL LOW (ref 6.5–8.1)

## 2015-10-15 LAB — CBC
HCT: 23.8 % — ABNORMAL LOW (ref 36.0–46.0)
Hemoglobin: 7.9 g/dL — ABNORMAL LOW (ref 12.0–15.0)
MCH: 24.7 pg — AB (ref 26.0–34.0)
MCHC: 33.2 g/dL (ref 30.0–36.0)
MCV: 74.4 fL — AB (ref 78.0–100.0)
PLATELETS: 153 10*3/uL (ref 150–400)
RBC: 3.2 MIL/uL — ABNORMAL LOW (ref 3.87–5.11)
RDW: 14.9 % (ref 11.5–15.5)
WBC: 10.6 10*3/uL — ABNORMAL HIGH (ref 4.0–10.5)

## 2015-10-15 LAB — LACTATE DEHYDROGENASE: LDH: 247 U/L — ABNORMAL HIGH (ref 98–192)

## 2015-10-15 LAB — URIC ACID: URIC ACID, SERUM: 4.1 mg/dL (ref 2.3–6.6)

## 2015-10-15 NOTE — Progress Notes (Signed)
Bridget Leach   Subjective: Post Partum Day 1 Vaginal delivery, 1 degree laceration Patient up ad lib, denies syncope or dizziness. Reports consuming regular diet without issues and denies N/V No issues with urination and reports bleeding is appropriate  Feeding:  Breast/bottle   Objective: Temp:  [97.6 F (36.4 C)-98.6 F (37 C)] 97.7 F (36.5 C) (02/05 0800) Pulse Rate:  [74-103] 74 (02/05 0800) Resp:  [16-20] 18 (02/05 0800) BP: (109-142)/(75-106) 130/88 mmHg (02/05 0800) SpO2:  [97 %-100 %] 100 % (02/05 0800) Weight:  [179 lb 12.8 oz (81.557 kg)] 179 lb 12.8 oz (81.557 kg) (02/04 1700)  Physical Exam:  General: alert and cooperative Ext: WNL, no significant  edema. No evidence of DVT seen on physical exam. Breast: Soft filling Lungs: CTAB Heart RRR without murmur  Abdomen:  Soft, fundus firm, lochia scant, + bowel sounds, non distended, non tender Lochia: appropriate Uterine Fundus: firm Laceration: healing well    Recent Labs  10/14/15 1233 10/15/15 0525  HGB 9.1* 7.9*  HCT 26.9* 23.8*    Assessment S/P Vaginal Delivery-Day 1 Stable  Normal Involution Breastfeeding/Bottlefeeding Anemia stable Mag DC at 0800  Plan: Continue current care Plan for discharge tomorrow, Breastfeeding and Lactation consult Lactation support   Leoncio Hansen, CNM, MSN 10/15/2015, 10:08 AM

## 2015-10-16 LAB — COMPREHENSIVE METABOLIC PANEL
ALK PHOS: 93 U/L (ref 38–126)
ALT: 19 U/L (ref 14–54)
AST: 18 U/L (ref 15–41)
Albumin: 2.5 g/dL — ABNORMAL LOW (ref 3.5–5.0)
Anion gap: 10 (ref 5–15)
BUN: 7 mg/dL (ref 6–20)
CALCIUM: 8.5 mg/dL — AB (ref 8.9–10.3)
CO2: 27 mmol/L (ref 22–32)
CREATININE: 0.59 mg/dL (ref 0.44–1.00)
Chloride: 102 mmol/L (ref 101–111)
Glucose, Bld: 83 mg/dL (ref 65–99)
Potassium: 3.7 mmol/L (ref 3.5–5.1)
Sodium: 139 mmol/L (ref 135–145)
Total Bilirubin: 0.6 mg/dL (ref 0.3–1.2)
Total Protein: 6.3 g/dL — ABNORMAL LOW (ref 6.5–8.1)

## 2015-10-16 LAB — LACTATE DEHYDROGENASE: LDH: 199 U/L — ABNORMAL HIGH (ref 98–192)

## 2015-10-16 LAB — URIC ACID: URIC ACID, SERUM: 4.5 mg/dL (ref 2.3–6.6)

## 2015-10-16 MED ORDER — DOCUSATE SODIUM 100 MG PO CAPS: 100.0000 mg | ORAL_CAPSULE | Freq: Two times a day (BID) | ORAL | Status: DC

## 2015-10-16 MED ORDER — FERROUS SULFATE 325 (65 FE) MG PO TABS: 325.0000 mg | ORAL_TABLET | Freq: Two times a day (BID) | ORAL | Status: DC

## 2015-10-16 NOTE — Discharge Instructions (Signed)
Postpartum Care After Vaginal Delivery °After you deliver your newborn (postpartum period), the usual stay in the hospital is 24-72 hours. If there were problems with your labor or delivery, or if you have other medical problems, you might be in the hospital longer.  °While you are in the hospital, you will receive help and instructions on how to care for yourself and your newborn during the postpartum period.  °While you are in the hospital: °· Be sure to tell your nurses if you have pain or discomfort, as well as where you feel the pain and what makes the pain worse. °· If you had an incision made near your vagina (episiotomy) or if you had some tearing during delivery, the nurses may put ice packs on your episiotomy or tear. The ice packs may help to reduce the pain and swelling. °· If you are breastfeeding, you may feel uncomfortable contractions of your uterus for a couple of weeks. This is normal. The contractions help your uterus get back to normal size. °· It is normal to have some bleeding after delivery. °· For the first 1-3 days after delivery, the flow is red and the amount may be similar to a period. °· It is common for the flow to start and stop. °· In the first few days, you may pass some small clots. Let your nurses know if you begin to pass large clots or your flow increases. °· Do not  flush blood clots down the toilet before having the nurse look at them. °· During the next 3-10 days after delivery, your flow should become more watery and pink or brown-tinged in color. °· Ten to fourteen days after delivery, your flow should be a small amount of yellowish-white discharge. °· The amount of your flow will decrease over the first few weeks after delivery. Your flow may stop in 6-8 weeks. Most women have had their flow stop by 12 weeks after delivery. °· You should change your sanitary pads frequently. °· Wash your hands thoroughly with soap and water for at least 20 seconds after changing pads, using  the toilet, or before holding or feeding your newborn. °· You should feel like you need to empty your bladder within the first 6-8 hours after delivery. °· In case you become weak, lightheaded, or faint, call your nurse before you get out of bed for the first time and before you take a shower for the first time. °· Within the first few days after delivery, your breasts may begin to feel tender and full. This is called engorgement. Breast tenderness usually goes away within 48-72 hours after engorgement occurs. You may also notice milk leaking from your breasts. If you are not breastfeeding, do not stimulate your breasts. Breast stimulation can make your breasts produce more milk. °· Spending as much time as possible with your newborn is very important. During this time, you and your newborn can feel close and get to know each other. Having your newborn stay in your room (rooming in) will help to strengthen the bond with your newborn.  It will give you time to get to know your newborn and become comfortable caring for your newborn. °· Your hormones change after delivery. Sometimes the hormone changes can temporarily cause you to feel sad or tearful. These feelings should not last more than a few days. If these feelings last longer than that, you should talk to your caregiver. °· If desired, talk to your caregiver about methods of family planning or contraception. °·   Talk to your caregiver about immunizations. Your caregiver may want you to have the following immunizations before leaving the hospital: °· Tetanus, diphtheria, and pertussis (Tdap) or tetanus and diphtheria (Td) immunization. It is very important that you and your family (including grandparents) or others caring for your newborn are up-to-date with the Tdap or Td immunizations. The Tdap or Td immunization can help protect your newborn from getting ill. °· Rubella immunization. °· Varicella (chickenpox) immunization. °· Influenza immunization. You should  receive this annual immunization if you did not receive the immunization during your pregnancy. °  °This information is not intended to replace advice given to you by your health care provider. Make sure you discuss any questions you have with your health care provider. °  °Document Released: 06/23/2007 Document Revised: 05/20/2012 Document Reviewed: 04/22/2012 °Elsevier Interactive Patient Education ©2016 [employer] ° °Iron-Rich Diet °Iron is a mineral that helps your body to produce hemoglobin. Hemoglobin is a protein in your red blood cells that carries oxygen to your body's tissues. Eating too little iron may cause you to feel weak and tired, and it can increase your risk for infection. Eating enough iron is necessary for your body's metabolism, muscle function, and nervous system. °Iron is naturally found in many foods. It can also be added to foods or fortified in foods. There are two types of dietary iron: °· Heme iron. Heme iron is absorbed by the body more easily than nonheme iron. Heme iron is found in meat, poultry, and fish. °· Nonheme iron. Nonheme iron is found in dietary supplements, iron-fortified grains, beans, and vegetables. °You may need to follow an iron-rich diet if: °· You have been diagnosed with iron deficiency or iron-deficiency anemia. °· You have a condition that prevents you from absorbing dietary iron, such as: °¨ Infection in your intestines. °¨ Celiac disease. This involves long-lasting (chronic) inflammation of your intestines. °· You do not eat enough iron. °· You eat a diet that is high in foods that impair iron absorption. °· You have lost a lot of blood. °· You have heavy bleeding during your menstrual cycle. °· You are pregnant. °WHAT IS MY PLAN? °Your health care provider may help you to determine how much iron you need per day based on your condition. Generally, when a person consumes sufficient amounts of iron in the diet, the following iron needs are met: °· Men. °¨ 14-18  years old: 11 mg per day. °¨ 19-50 years old: 8 mg per day. °· Women.   °¨ 14-18 years old: 15 mg per day. °¨ 19-50 years old: 18 mg per day. °¨ Over 50 years old: 8 mg per day. °¨ Pregnant women: 27 mg per day. °¨ Breastfeeding women: 9 mg per day. °WHAT DO I NEED TO KNOW ABOUT AN IRON-RICH DIET? °· Eat fresh fruits and vegetables that are high in vitamin C along with foods that are high in iron. This will help increase the amount of iron that your body absorbs from food, especially with foods containing nonheme iron. Foods that are high in vitamin C include oranges, peppers, tomatoes, and mango. °· Take iron supplements only as directed by your health care provider. Overdose of iron can be life-threatening. If you were prescribed iron supplements, take them with orange juice or a vitamin C supplement. °· Cook foods in pots and pans that are made from iron.   °· Eat nonheme iron-containing foods alongside foods that are high in heme iron. This helps to improve your iron absorption.   °·   Certain foods and drinks contain compounds that impair iron absorption. Avoid eating these foods in the same meal as iron-rich foods or with iron supplements. These include: °¨ Coffee, black tea, and red wine. °¨ Milk, dairy products, and foods that are high in calcium. °¨ Beans, soybeans, and peas. °¨ Whole grains. °· When eating foods that contain both nonheme iron and compounds that impair iron absorption, follow these tips to absorb iron better.   °¨ Soak beans overnight before cooking. °¨ Soak whole grains overnight and drain them before using. °¨ Ferment flours before baking, such as using yeast in bread dough. °WHAT FOODS CAN I EAT? °Grains  °Iron-fortified breakfast cereal. Iron-fortified whole-wheat bread. Enriched rice. Sprouted grains. °Vegetables  °Spinach. Potatoes with skin. Green peas. Broccoli. Red and green bell peppers. Fermented vegetables. °Fruits  °Prunes. Raisins. Oranges. Strawberries. Mango.  Grapefruit. °Meats and Other Protein Sources  °Beef liver. Oysters. Beef. Shrimp. Turkey. Chicken. Tuna. Sardines. Chickpeas. Nuts. Tofu. °Beverages  °Tomato juice. Fresh orange juice. Prune juice. Hibiscus tea. Fortified instant breakfast shakes. °Condiments  °Tahini. Fermented soy sauce.  °Sweets and Desserts  °Black-strap molasses.  °Other  °Wheat germ. °The items listed above may not be a complete list of recommended foods or beverages. Contact your dietitian for more options.  °WHAT FOODS ARE NOT RECOMMENDED? °Grains  °Whole grains. Bran cereal. Bran flour. Oats. °Vegetables  °Artichokes. Brussels sprouts. Kale. °Fruits  °Blueberries. Raspberries. Strawberries. Figs. °Meats and Other Protein Sources  °Soybeans. Products made from soy protein. °Dairy  °Milk. Cream. Cheese. Yogurt. Cottage cheese. °Beverages  °Coffee. Black tea. Red wine. °Sweets and Desserts  °Cocoa. Chocolate. Ice cream. °Other  °Basil. Oregano. Parsley. °The items listed above may not be a complete list of foods and beverages to avoid. Contact your dietitian for more information.  °  °This information is not intended to replace advice given to you by your health care provider. Make sure you discuss any questions you have with your health care provider. °  °Document Released: 04/09/2005 Document Revised: 09/16/2014 Document Reviewed: 03/23/2014 °Elsevier Interactive Patient Education ©2016 Elsevier Inc. ° °

## 2015-10-16 NOTE — Discharge Summary (Signed)
OB Discharge Summary     Patient Name: Bridget Leach DOB: Nov 02, 1990 MRN: [id number]  Date of admission: 10/12/2015 Delivering MD: Alphonzo Severance   Date of discharge: 10/16/2015  Admitting diagnosis: 36w evaluation Intrauterine pregnancy: [redacted]w[redacted]d     Secondary diagnosis:  Principal Problem:   Spontaneous vaginal delivery Active Problems:   Severe preeclampsia   Genital herpes   Cannabis abuse   Anemia   Herpes simplex type 1 infection   Positive GBS test   Hypokalemia  Additional problems: none     Discharge diagnosis: Preterm Pregnancy Delivered                                                                                                Post partum procedures:24hr postpartum magnesium sulfate  Augmentation: AROM and Pitocin  Complications: None  Hospital course:  Induction of Labor With Vaginal Delivery   25 y.o. yo G2P0111 at [redacted]w[redacted]d was admitted to the hospital 10/12/2015 for induction of labor.  Indication for induction: Preeclampsia.  Patient had an uncomplicated labor course as follows: Membrane Rupture Time/Date: 2:00 PM ,10/13/2015   Intrapartum Procedures: Episiotomy: None [1]                                         Lacerations:  1st degree [2];Periurethral [8];Perineal [11]  Patient had delivery of a Viable infant.  Information for the patient's newborn:  Rikki, Trosper [[id number]]  Delivery Method: Vaginal, Spontaneous Delivery (Filed from Delivery Summary)    10/14/2015  Details of delivery can be found in separate delivery note.  Patient had a routine postpartum course. Patient is discharged home 10/16/2015.   Physical exam  Filed Vitals:   10/15/15 1928 10/15/15 2326 10/16/15 0331 10/16/15 0800  BP: 118/71 108/66 145/82 132/90  Pulse: 84 65 73 75  Temp: 98.7 F (37.1 C) 98.5 F (36.9 C) 98.2 F (36.8 C) 98.6 F (37 C)  TempSrc: Oral Oral Oral Oral  Resp: Height:      Weight:      SpO2:       General: alert Lochia:  appropriate Uterine Fundus: firm Incision: Healing well with no significant drainage DVT Evaluation: No evidence of DVT seen on physical exam. Labs: Lab Results  Component Value Date   WBC 10.6* 10/15/2015   HGB 7.9* 10/15/2015   HCT 23.8* 10/15/2015   MCV 74.4* 10/15/2015   PLT 153 10/15/2015   CMP Latest Ref Rng 10/16/2015  Glucose 65 - 99 mg/dL 83  BUN 6 - 20 mg/dL 7  Creatinine 1.30 - 8.65 mg/dL 7.84  Sodium 696 - 295 mmol/L 139  Potassium 3.5 - 5.1 mmol/L 3.7  Chloride 101 - 111 mmol/L 102  CO2 22 - 32 mmol/L 27  Calcium 8.9 - 10.3 mg/dL 2.8(U)  Total Protein 6.5 - 8.1 g/dL 6.3(L)  Total Bilirubin 0.3 - 1.2 mg/dL 0.6  Alkaline Phos 38 - 126 U/L 93  AST 15 - 41 U/L 18  ALT 14 -  54 U/L 19    Discharge instruction: per After Visit Summary and "Baby and Me Booklet".  After visit meds:    Medication List    ASK your doctor about these medications        acetaminophen 325 MG tablet  Commonly known as:  TYLENOL  Take 650 mg by mouth every 6 (six) hours as needed for moderate pain.     valACYclovir 500 MG tablet  Commonly known as:  VALTREX  Take 500 mg by mouth daily.        Diet: routine diet  Activity: Advance as tolerated. Pelvic rest for 6 weeks.   Outpatient follow WG:[facility] office to schedule a BP check at 1 week, a postpartum visit at 5 weeks, and a contraception visit at 6 weeks Follow up Appt:No future appointments. Follow up Visit:No Follow-up on file.  Postpartum contraception: Nexplanon  Newborn Data: Live born female  Birth Weight: 5 lb 2.5 oz (2340 g) APGAR: 7, 8  Baby Feeding: Bottle and Breast Disposition:rooming in   10/16/2015 Alphonzo Severance, CNM

## 2015-10-16 NOTE — Progress Notes (Signed)
The evening blood pressure was a little elevated and was rechecked at discharge and was still 136/95.  Alphonzo Severance, CNM was notified and stated that it was okay to continue with discharge.

## 2015-10-17 LAB — CULTURE, BLOOD (ROUTINE X 2): CULTURE: NO GROWTH

## 2015-10-17 NOTE — Progress Notes (Signed)
Baby is being discharged today (10/17/15) and when mom was discharged last night the smart start nurse was called with information that they needed to follow up with her in 2-3 days for a blood pressure check. The smart start nurse has not been seen this morning rounding on patients so she was called to confirm that the information was received.  Had to leave a message to confirm.

## 2015-10-18 LAB — CULTURE, BLOOD (ROUTINE X 2): Culture: NO GROWTH

## 2015-12-21 ENCOUNTER — Other Ambulatory Visit (HOSPITAL_COMMUNITY): Payer: Self-pay | Admitting: Obstetrics and Gynecology

## 2016-03-04 ENCOUNTER — Encounter (HOSPITAL_COMMUNITY): Payer: Self-pay

## 2016-03-04 ENCOUNTER — Ambulatory Visit (HOSPITAL_COMMUNITY)
Admission: EM | Admit: 2016-03-04 | Discharge: 2016-03-04 | Disposition: A | Discharge status: Home / Self Care | Payer: Medicaid Other | Attending: Family Medicine | Admitting: Family Medicine

## 2016-03-04 DIAGNOSIS — L509 Urticaria, unspecified: Secondary | ICD-10-CM | POA: Diagnosis not present

## 2016-03-04 MED ORDER — PREDNISONE 10 MG PO TABS: ORAL_TABLET | ORAL | Status: DC

## 2016-03-04 NOTE — Discharge Instructions (Signed)
Hives Hives are itchy, red, swollen areas of the skin. They can vary in size and location on your body. Hives can come and go for hours or several days (acute hives) or for several weeks (chronic hives). Hives do not spread from person to person (noncontagious). They may get worse with scratching, exercise, and emotional stress. CAUSES   Allergic reaction to food, additives, or drugs.  Infections, including the common cold.  Illness, such as vasculitis, lupus, or thyroid disease.  Exposure to sunlight, heat, or cold.  Exercise.  Stress.  Contact with chemicals. SYMPTOMS   Red or white swollen patches on the skin. The patches may change size, shape, and location quickly and repeatedly.  Itching.  Swelling of the hands, feet, and face. This may occur if hives develop deeper in the skin. DIAGNOSIS  Your caregiver can usually tell what is wrong by performing a physical exam. Skin or blood tests may also be done to determine the cause of your hives. In some cases, the cause cannot be determined. TREATMENT  Mild cases usually get better with medicines such as antihistamines. Severe cases may require an emergency epinephrine injection. If the cause of your hives is known, treatment includes avoiding that trigger.  HOME CARE INSTRUCTIONS   Avoid causes that trigger your hives.  Take antihistamines as directed by your caregiver to reduce the severity of your hives. Non-sedating or low-sedating antihistamines are usually recommended. Do not drive while taking an antihistamine.  Take any other medicines prescribed for itching as directed by your caregiver.  Wear loose-fitting clothing.  Keep all follow-up appointments as directed by your caregiver. SEEK MEDICAL CARE IF:   You have persistent or severe itching that is not relieved with medicine.  You have painful or swollen joints. SEEK IMMEDIATE MEDICAL CARE IF:   You have a fever.  Your tongue or lips are swollen.  You have  trouble breathing or swallowing.  You feel tightness in the throat or chest.  You have abdominal pain. These problems may be the first sign of a life-threatening allergic reaction. Call your local emergency services (911 in [place] MAKE SURE YOU:   Understand these instructions.  Will watch your condition.  Will get help right away if you are not doing well or get worse.   This information is not intended to replace advice given to you by your health care provider. Make sure you discuss any questions you have with your health care provider.   Document Released: 08/26/2005 Document Revised: 08/31/2013 Document Reviewed: 11/19/2011 Elsevier Interactive Patient Education 2016 Elsevier Inc.  

## 2016-03-04 NOTE — ED Notes (Signed)
Patient presents with a rash on her right arm, hand and neck x1 week, pt has applied hydrocortisone cream but rash is still itchy and spreading No acute distress

## 2016-03-04 NOTE — ED Provider Notes (Signed)
CSN: [id number]     Arrival date & time 03/04/16  1621 History   First MD Initiated Contact with Patient 03/04/16 1751     Chief Complaint  Patient presents with  . Rash   (Consider location/radiation/quality/duration/timing/severity/associated sxs/prior Treatment) HPI History obtained from patient: Location:  Neck and arms Context/Duration:  Sudden onset of about 1 week ago of a rash with swelling that comes and goes Severity: No pain itching 4  Quality: Itching Timing:         Episodic to constant   Home Treatment: Over-the-counter medicines without relief of symptoms Associated symptoms:  Significant itching Family History: Hypertension in grandmother    Past Medical History  Diagnosis Date  . Sickle cell trait carrier 12/22/2012    Hgb AS  . Herpes genitalia   . Chlamydia 06/24/15   Past Surgical History  Procedure Laterality Date  . Dental surgery    . Dilation and curettage of uterus     Family History  Problem Relation Age of Onset  . Diabetes Maternal Grandmother   . Hypertension Maternal Grandmother   . Cancer Maternal Grandmother     leukemia  . Diabetes Maternal Grandfather   . Hypertension Maternal Grandfather    Social History  Substance Use Topics  . Smoking status: Never Smoker   . Smokeless tobacco: Never Used  . Alcohol Use: No   OB History    Gravida Para Term Preterm AB TAB SAB Ectopic Multiple Living   2 1  1 1 1    0 1     Review of Systems  Denies: HEADACHE, NAUSEA, ABDOMINAL PAIN, CHEST PAIN, CONGESTION, DYSURIA, SHORTNESS OF BREATH  Allergies  Review of patient's allergies indicates no known allergies.  Home Medications   Prior to Admission medications   Medication Sig Start Date End Date Taking? Authorizing Provider  docusate sodium (COLACE) 100 MG capsule Take 1 capsule (100 mg total) by mouth 2 (two) times daily. 10/16/15   Alphonzo Severanceachel Stall, CNM  ferrous sulfate 325 (65 FE) MG tablet Take 1 tablet (325 mg total) by mouth 2 (two) times  daily with a meal. 10/16/15 10/15/16  Alphonzo Severanceachel Stall, CNM  predniSONE (DELTASONE) 10 MG tablet Sig: 4 tables once a day for 3 days, 3 tablets once a day X3 days, 2 tablets a day for 3 days, 1 tablet a day for 3 days. 03/04/16   Tharon AquasFrank C Peta Peachey, PA   Meds Ordered and Administered this Visit  Medications - No data to display  BP 137/83 mmHg  Pulse 67  Temp(Src) 98.4 F (36.9 C) (Oral)  Resp 15  SpO2 100%  LMP 02/10/2016 (Exact Date) No data found.   Physical Exam NURSES NOTES AND VITAL SIGNS REVIEWED. CONSTITUTIONAL: Well developed, well nourished, no acute distress HEENT: normocephalic, atraumatic EYES: Conjunctiva normal NECK:normal ROM, supple, no adenopathy PULMONARY:No respiratory distress, normal effort ABDOMINAL: Soft, ND, NT BS+, No CVAT MUSCULOSKELETAL: Normal ROM of all extremities,  SKIN: warm and dry neck urticaria noted upper neck and both arms PSYCHIATRIC: Mood and affect, behavior are normal  ED Course  Procedures (including critical care time)  Labs Review Labs Reviewed - No data to display  Imaging Review No results found.   Visual Acuity Review  Right Eye Distance:   Left Eye Distance:   Bilateral Distance:    Right Eye Near:   Left Eye Near:    Bilateral Near:       PREDNISONE TAPER  MDM   1. Urticaria  Patient is reassured that there are no issues that require transfer to higher level of care at this time or additional tests. Patient is advised to continue home symptomatic treatment. Patient is advised that if there are new or worsening symptoms to attend the emergency department, contact primary care provider, or return to UC. Instructions of care provided discharged home in stable condition.    THIS NOTE WAS GENERATED USING A VOICE RECOGNITION SOFTWARE PROGRAM. ALL REASONABLE EFFORTS  WERE MADE TO PROOFREAD THIS DOCUMENT FOR ACCURACY.  I have verbally reviewed the discharge instructions with the patient. A printed AVS was given to the  patient.  All questions were answered prior to discharge.      Tharon AquasFrank C Meoshia Billing, PA 03/04/16 [phone]

## 2016-07-23 ENCOUNTER — Inpatient Hospital Stay (HOSPITAL_COMMUNITY)
Admission: AD | Admit: 2016-07-23 | Discharge: 2016-07-23 | Disposition: A | Discharge status: Home / Self Care | Payer: Medicaid Other | Source: Ambulatory Visit | Attending: Obstetrics and Gynecology | Admitting: Obstetrics and Gynecology

## 2016-07-23 ENCOUNTER — Inpatient Hospital Stay (HOSPITAL_COMMUNITY): Payer: Medicaid Other

## 2016-07-23 ENCOUNTER — Encounter (HOSPITAL_COMMUNITY): Payer: Self-pay

## 2016-07-23 DIAGNOSIS — B373 Candidiasis of vulva and vagina: Secondary | ICD-10-CM | POA: Diagnosis not present

## 2016-07-23 DIAGNOSIS — Z3A01 Less than 8 weeks gestation of pregnancy: Secondary | ICD-10-CM | POA: Diagnosis not present

## 2016-07-23 DIAGNOSIS — D573 Sickle-cell trait: Secondary | ICD-10-CM | POA: Diagnosis not present

## 2016-07-23 DIAGNOSIS — O26891 Other specified pregnancy related conditions, first trimester: Secondary | ICD-10-CM | POA: Insufficient documentation

## 2016-07-23 DIAGNOSIS — O98811 Other maternal infectious and parasitic diseases complicating pregnancy, first trimester: Secondary | ICD-10-CM | POA: Insufficient documentation

## 2016-07-23 DIAGNOSIS — B3731 Acute candidiasis of vulva and vagina: Secondary | ICD-10-CM

## 2016-07-23 DIAGNOSIS — Z79899 Other long term (current) drug therapy: Secondary | ICD-10-CM | POA: Diagnosis not present

## 2016-07-23 DIAGNOSIS — R109 Unspecified abdominal pain: Secondary | ICD-10-CM | POA: Diagnosis present

## 2016-07-23 DIAGNOSIS — R102 Pelvic and perineal pain: Secondary | ICD-10-CM | POA: Insufficient documentation

## 2016-07-23 DIAGNOSIS — Z3491 Encounter for supervision of normal pregnancy, unspecified, first trimester: Secondary | ICD-10-CM | POA: Diagnosis not present

## 2016-07-23 DIAGNOSIS — O26899 Other specified pregnancy related conditions, unspecified trimester: Secondary | ICD-10-CM

## 2016-07-23 LAB — URINALYSIS, ROUTINE W REFLEX MICROSCOPIC
BILIRUBIN URINE: NEGATIVE
Glucose, UA: NEGATIVE mg/dL
Hgb urine dipstick: NEGATIVE
Ketones, ur: NEGATIVE mg/dL
NITRITE: NEGATIVE
Protein, ur: NEGATIVE mg/dL
SPECIFIC GRAVITY, URINE: 1.025 (ref 1.005–1.030)
pH: 6 (ref 5.0–8.0)

## 2016-07-23 LAB — CBC
HEMATOCRIT: 32.6 % — AB (ref 36.0–46.0)
Hemoglobin: 11.5 g/dL — ABNORMAL LOW (ref 12.0–15.0)
MCH: 24.7 pg — ABNORMAL LOW (ref 26.0–34.0)
MCHC: 35.3 g/dL (ref 30.0–36.0)
MCV: 70.1 fL — ABNORMAL LOW (ref 78.0–100.0)
Platelets: 156 10*3/uL (ref 150–400)
RBC: 4.65 MIL/uL (ref 3.87–5.11)
RDW: 17.3 % — AB (ref 11.5–15.5)
WBC: 6.9 10*3/uL (ref 4.0–10.5)

## 2016-07-23 LAB — WET PREP, GENITAL
Clue Cells Wet Prep HPF POC: NONE SEEN
SPERM: NONE SEEN
TRICH WET PREP: NONE SEEN

## 2016-07-23 LAB — URINE MICROSCOPIC-ADD ON

## 2016-07-23 LAB — POCT PREGNANCY, URINE: PREG TEST UR: POSITIVE — AB

## 2016-07-23 LAB — HCG, QUANTITATIVE, PREGNANCY: hCG, Beta Chain, Quant, S: 77969 m[IU]/mL — ABNORMAL HIGH (ref <5)

## 2016-07-23 MED ORDER — TERCONAZOLE 0.8 % VA CREA: 1.0000 | TOPICAL_CREAM | Freq: Every day | VAGINAL | 0 refills | Status: DC

## 2016-07-23 NOTE — MAU Provider Note (Signed)
History     CSN: [id number]  Arrival date and time: 07/23/16 0903   First Provider Initiated Contact with Patient 07/23/16 [phone]      Chief Complaint  Patient presents with  . Abdominal Pain   HPI Bridget Leach is a 25 y.o. [id number] at 8927w0d by LMP who presents with abdominal pain & possible pregnancy. Pt thought she may be pregnant but had negative HPT last week. UPT here is positive. LMP 10/17.  Reports intermittent lower abdominal pain that occurs daily for the last 2 weeks. Pain last occurred this morning and lasted for several minutes. No pain now. Denies n/v/d, constipation, dysuria, urinary frequency, vaginal bleeding, or vaginal discharge.   OB History    Gravida Para Term Preterm AB Living   3 1   1 1 1    SAB TAB Ectopic Multiple Live Births     1   0 1      Past Medical History:  Diagnosis Date  . Chlamydia 06/24/15  . Herpes genitalia   . Sickle cell trait carrier 12/22/2012   Hgb AS    Past Surgical History:  Procedure Laterality Date  . DENTAL SURGERY    . DILATION AND CURETTAGE OF UTERUS      Family History  Problem Relation Age of Onset  . Diabetes Maternal Grandmother   . Hypertension Maternal Grandmother   . Cancer Maternal Grandmother     leukemia  . Diabetes Maternal Grandfather   . Hypertension Maternal Grandfather     Social History  Substance Use Topics  . Smoking status: Never Smoker  . Smokeless tobacco: Never Used  . Alcohol use No    Allergies: No Known Allergies  Prescriptions Prior to Admission  Medication Sig Dispense Refill Last Dose  . docusate sodium (COLACE) 100 MG capsule Take 1 capsule (100 mg total) by mouth 2 (two) times daily. 60 capsule 2 Unknown at Unknown time  . ferrous sulfate 325 (65 FE) MG tablet Take 1 tablet (325 mg total) by mouth 2 (two) times daily with a meal. 60 tablet 2 Unknown at Unknown time  . HYDROcodone-acetaminophen (NORCO/VICODIN) 5-325 MG tablet TK 1 T PO TID PRN P  0   . predniSONE (DELTASONE)  10 MG tablet Sig: 4 tables once a day for 3 days, 3 tablets once a day X3 days, 2 tablets a day for 3 days, 1 tablet a day for 3 days. 30 tablet 0     Review of Systems  Constitutional: Negative.   Gastrointestinal: Positive for abdominal pain. Negative for constipation, diarrhea, nausea and vomiting.  Genitourinary: Negative.   Neurological: Positive for dizziness and headaches.   Physical Exam   Blood pressure 110/84, pulse 87, temperature 98.2 F (36.8 C), resp. rate 16, last menstrual period 06/25/2016, unknown if currently breastfeeding.  Physical Exam  Nursing note and vitals reviewed. Constitutional: She is oriented to person, place, and time. She appears well-developed and well-nourished. No distress.  HENT:  Head: Normocephalic and atraumatic.  Eyes: Conjunctivae are normal. Right eye exhibits no discharge. Left eye exhibits no discharge. No scleral icterus.  Neck: Normal range of motion.  Respiratory: Effort normal. No respiratory distress.  GI: Soft. She exhibits no distension. There is no tenderness. There is no rebound and no guarding.  Genitourinary: Uterus normal. Cervix exhibits no motion tenderness. Right adnexum displays no mass and no tenderness. Left adnexum displays no mass and no tenderness.  Genitourinary Comments: Cervix closed  Neurological: She is alert and oriented to person,  place, and time.  Skin: Skin is warm and dry. She is not diaphoretic.  Psychiatric: She has a normal mood and affect. Her behavior is normal. Judgment and thought content normal.    MAU Course  Procedures Results for orders placed or performed during the hospital encounter of 07/23/16 (from the past 24 hour(s))  Urinalysis, Routine w reflex microscopic (not at [facility])     Status: Abnormal   Collection Time: 07/23/16  9:10 AM  Result Value Ref Range   Color, Urine YELLOW YELLOW   APPearance CLEAR CLEAR   Specific Gravity, Urine 1.025 1.005 - 1.030   pH 6.0 5.0 - 8.0   Glucose, UA  NEGATIVE NEGATIVE mg/dL   Hgb urine dipstick NEGATIVE NEGATIVE   Bilirubin Urine NEGATIVE NEGATIVE   Ketones, ur NEGATIVE NEGATIVE mg/dL   Protein, ur NEGATIVE NEGATIVE mg/dL   Nitrite NEGATIVE NEGATIVE   Leukocytes, UA MODERATE (A) NEGATIVE  Urine microscopic-add on     Status: Abnormal   Collection Time: 07/23/16  9:10 AM  Result Value Ref Range   Squamous Epithelial / LPF 0-5 (A) NONE SEEN   WBC, UA 0-5 0 - 5 WBC/hpf   RBC / HPF 0-5 0 - 5 RBC/hpf   Bacteria, UA MANY (A) NONE SEEN   Urine-Other MUCOUS PRESENT   Pregnancy, urine POC     Status: Abnormal   Collection Time: 07/23/16  9:24 AM  Result Value Ref Range   Preg Test, Ur POSITIVE (A) NEGATIVE  CBC     Status: Abnormal   Collection Time: 07/23/16  9:40 AM  Result Value Ref Range   WBC 6.9 4.0 - 10.5 K/uL   RBC 4.65 3.87 - 5.11 MIL/uL   Hemoglobin 11.5 (L) 12.0 - 15.0 g/dL   HCT 47.832.6 (L) 29.536.0 - 62.146.0 %   MCV 70.1 (L) 78.0 - 100.0 fL   MCH 24.7 (L) 26.0 - 34.0 pg   MCHC 35.3 30.0 - 36.0 g/dL   RDW 30.817.3 (H) 65.711.5 - 84.615.5 %   Platelets 156 150 - 400 K/uL  hCG, quantitative, pregnancy     Status: Abnormal   Collection Time: 07/23/16  9:40 AM  Result Value Ref Range   hCG, Beta Chain, Quant, S 77,969 (H) <5 mIU/mL  Wet prep, genital     Status: Abnormal   Collection Time: 07/23/16  9:50 AM  Result Value Ref Range   Yeast Wet Prep HPF POC PRESENT (A) NONE SEEN   Trich, Wet Prep NONE SEEN NONE SEEN   Clue Cells Wet Prep HPF POC NONE SEEN NONE SEEN   WBC, Wet Prep HPF POC MANY (A) NONE SEEN   Sperm NONE SEEN     MDM +UPT UA, wet prep, GC/chlamydia, CBC, ABO/Rh, quant hCG, HIV, and US today to rule out ectopic pregnancy  Ultrasound shows SIUP with cardiac activity measuring 7157w0d  Assessment and Plan  A: 1. Normal IUP (intrauterine pregnancy) on prenatal ultrasound, first trimester   2. Abdominal pain affecting pregnancy   3. Vaginal yeast infection    P: Discharge home Rx terazol Pregnancy verification letter &  provider list given -- start prenatal care Discussed reasons to return to MAU GC/CT & urine culture pending  Judeth Hornrin Jenniah Bhavsar 07/23/2016, 9:43 AM

## 2016-07-23 NOTE — Discharge Instructions (Signed)
First Trimester of Pregnancy The first trimester of pregnancy is from week 1 until the end of week 12 (months 1 through 3). During this time, your baby will begin to develop inside you. At 6-8 weeks, the eyes and face are formed, and the heartbeat can be seen on ultrasound. At the end of 12 weeks, all the baby's organs are formed. Prenatal care is all the medical care you receive before the birth of your baby. Make sure you get good prenatal care and follow all of your doctor's instructions. Follow these instructions at home: Medicines  Take medicine only as told by your doctor. Some medicines are safe and some are not during pregnancy.  Take your prenatal vitamins as told by your doctor.  Take medicine that helps you poop (stool softener) as needed if your doctor says it is okay. Diet  Eat regular, healthy meals.  Your doctor will tell you the amount of weight gain that is right for you.  Avoid raw meat and uncooked cheese.  If you feel sick to your stomach (nauseous) or throw up (vomit):  Eat 4 or 5 small meals a day instead of 3 large meals.  Try eating a few soda crackers.  Drink liquids between meals instead of during meals.  If you have a hard time pooping (constipation):  Eat high-fiber foods like fresh vegetables, fruit, and whole grains.  Drink enough fluids to keep your pee (urine) clear or pale yellow. Activity and Exercise  Exercise only as told by your doctor. Stop exercising if you have cramps or pain in your lower belly (abdomen) or low back.  Try to avoid standing for long periods of time. Move your legs often if you must stand in one place for a long time.  Avoid heavy lifting.  Wear low-heeled shoes. Sit and stand up straight.  You can have sex unless your doctor tells you not to. Relief of Pain or Discomfort  Wear a good support bra if your breasts are sore.  Take warm water baths (sitz baths) to soothe pain or discomfort caused by hemorrhoids. Use  hemorrhoid cream if your doctor says it is okay.  Rest with your legs raised if you have leg cramps or low back pain.  Wear support hose if you have puffy, bulging veins (varicose veins) in your legs. Raise (elevate) your feet for 15 minutes, 3-4 times a day. Limit salt in your diet. Prenatal Care  Schedule your prenatal visits by the twelfth week of pregnancy.  Write down your questions. Take them to your prenatal visits.  Keep all your prenatal visits as told by your doctor. Safety  Wear your seat belt at all times when driving.  Make a list of emergency phone numbers. The list should include numbers for family, friends, the hospital, and police and fire departments. General Tips  Ask your doctor for a referral to a local prenatal class. Begin classes no later than at the start of month 6 of your pregnancy.  Ask for help if you need counseling or help with nutrition. Your doctor can give you advice or tell you where to go for help.  Do not use hot tubs, steam rooms, or saunas.  Do not douche or use tampons or scented sanitary pads.  Do not cross your legs for long periods of time.  Avoid litter boxes and soil used by cats.  Avoid all smoking, herbs, and alcohol. Avoid drugs not approved by your doctor.  Do not use any tobacco products,  including cigarettes, chewing tobacco, and electronic cigarettes. If you need help quitting, ask your doctor. You may get counseling or other support to help you quit.  Visit your dentist. At home, brush your teeth with a soft toothbrush. Be gentle when you floss. Get help if:  You are dizzy.  You have mild cramps or pressure in your lower belly.  You have a nagging pain in your belly area.  You continue to feel sick to your stomach, throw up, or have watery poop (diarrhea).  You have a bad smelling fluid coming from your vagina.  You have pain with peeing (urination).  You have increased puffiness (swelling) in your face, hands,  legs, or ankles. Get help right away if:  You have a fever.  You are leaking fluid from your vagina.  You have spotting or bleeding from your vagina.  You have very bad belly cramping or pain.  You gain or lose weight rapidly.  You throw up blood. It may look like coffee grounds.  You are around people who have [place] measles, fifth disease, or chickenpox.  You have a very bad headache.  You have shortness of breath.  You have any kind of trauma, such as from a fall or a car accident. This information is not intended to replace advice given to you by your health care provider. Make sure you discuss any questions you have with your health care provider. Document Released: 02/12/2008 Document Revised: 02/01/2016 Document Reviewed: 07/06/2013 Elsevier Interactive Patient Education  2017 Elsevier Inc. Vaginal Yeast infection, Adult Vaginal yeast infection is a condition that causes soreness, swelling, and redness (inflammation) of the vagina. It also causes vaginal discharge. This is a common condition. Some women get this infection frequently. What are the causes? This condition is caused by a change in the normal balance of the yeast (candida) and bacteria that live in the vagina. This change causes an overgrowth of yeast, which causes the inflammation. What increases the risk? This condition is more likely to develop in:  Women who take antibiotic medicines.  Women who have diabetes.  Women who take birth control pills.  Women who are pregnant.  Women who douche often.  Women who have a weak defense (immune) system.  Women who have been taking steroid medicines for a long time.  Women who frequently wear tight clothing. What are the signs or symptoms? Symptoms of this condition include:  White, thick vaginal discharge.  Swelling, itching, redness, and irritation of the vagina. The lips of the vagina (vulva) may be affected as well.  Pain or a burning feeling  while urinating.  Pain during sex. How is this diagnosed? This condition is diagnosed with a medical history and physical exam. This will include a pelvic exam. Your health care provider will examine a sample of your vaginal discharge under a microscope. Your health care provider may send this sample for testing to confirm the diagnosis. How is this treated? This condition is treated with medicine. Medicines may be over-the-counter or prescription. You may be told to use one or more of the following:  Medicine that is taken orally.  Medicine that is applied as a cream.  Medicine that is inserted directly into the vagina (suppository). Follow these instructions at home:  Take or apply over-the-counter and prescription medicines only as told by your health care provider.  Do not have sex until your health care provider has approved. Tell your sex partner that you have a yeast infection. That person should  go to his or her health care provider if he or she develops symptoms.  Do not wear tight clothes, such as pantyhose or tight pants.  Avoid using tampons until your health care provider approves.  Eat more yogurt. This may help to keep your yeast infection from returning.  Try taking a sitz bath to help with discomfort. This is a warm water bath that is taken while you are sitting down. The water should only come up to your hips and should cover your buttocks. Do this 3-4 times per day or as told by your health care provider.  Do not douche.  Wear breathable, cotton underwear.  If you have diabetes, keep your blood sugar levels under control. Contact a health care provider if:  You have a fever.  Your symptoms go away and then return.  Your symptoms do not get better with treatment.  Your symptoms get worse.  You have new symptoms.  You develop blisters in or around your vagina.  You have blood coming from your vagina and it is not your menstrual period.  You develop  pain in your abdomen. This information is not intended to replace advice given to you by your health care provider. Make sure you discuss any questions you have with your health care provider. Document Released: 06/05/2005 Document Revised: 02/07/2016 Document Reviewed: 02/27/2015 Elsevier Interactive Patient Education  2017 [employer]

## 2016-07-23 NOTE — MAU Note (Signed)
Pt has someone in room to watch infant while she goes to ultrasound.

## 2016-07-23 NOTE — MAU Note (Signed)
Pt has infant with her. Advised her that she would need someone to watch the infant when she went to ultrasound. She says she doesn't have anyone that can come here unless she went to pick them up.

## 2016-07-23 NOTE — MAU Note (Signed)
Pt having abdominal pain for 2 weeks. Feeling dizzy also. Feeling nauseous, no vomiting

## 2016-07-24 ENCOUNTER — Encounter: Payer: Self-pay | Admitting: Student

## 2016-07-24 DIAGNOSIS — R8271 Bacteriuria: Secondary | ICD-10-CM | POA: Insufficient documentation

## 2016-07-24 LAB — CULTURE, OB URINE: Culture: 10000 — AB

## 2016-07-24 LAB — HIV ANTIBODY (ROUTINE TESTING W REFLEX): HIV SCREEN 4TH GENERATION: NONREACTIVE

## 2016-07-24 LAB — GC/CHLAMYDIA PROBE AMP (~~LOC~~) NOT AT ARMC
CHLAMYDIA, DNA PROBE: NEGATIVE
NEISSERIA GONORRHEA: NEGATIVE

## 2016-09-09 NOTE — L&D Delivery Note (Signed)
26 y.o. [id number] at 7257w3d delivered a viable female infant in cephalic, LOA position. Anterior shoulder delivered with ease. 60 sec delayed cord clamping. Cord clamped x2 and cut. Placenta delivered spontaneously intact, with 3VC. Fundus firm on exam with massage and pitocin. Good hemostasis noted.  Anesthesia: Epidural Laceration: mild bilateral labial tears approximate well, no sutures required Good hemostasis noted. EBL: 200 cc  Mom and baby recovering in LDR.    Apgars: APGAR (1 MIN): 8   APGAR (5 MINS): 9   APGAR (10 MINS):   Weight: Pending skin to skin  Sponge and instrument count were correct x2. Placenta sent to L&D pathology.  Howard PouchLauren Feng, MD PGY-1 Family Medicine 02/14/2017, 1:00 AM   The above was performed under my direct supervision and guidance.

## 2016-10-16 ENCOUNTER — Encounter: Payer: Self-pay | Admitting: *Deleted

## 2016-10-16 ENCOUNTER — Encounter: Payer: Self-pay | Admitting: Certified Nurse Midwife

## 2016-10-16 ENCOUNTER — Other Ambulatory Visit (HOSPITAL_COMMUNITY)
Admission: RE | Admit: 2016-10-16 | Discharge: 2016-10-16 | Disposition: A | Discharge status: Home / Self Care | Payer: Medicaid Other | Source: Ambulatory Visit | Attending: Certified Nurse Midwife | Admitting: Certified Nurse Midwife

## 2016-10-16 ENCOUNTER — Ambulatory Visit (INDEPENDENT_AMBULATORY_CARE_PROVIDER_SITE_OTHER): Payer: Medicaid Other | Admitting: Certified Nurse Midwife

## 2016-10-16 VITALS — BP 108/73 | HR 83 | Wt 164.0 lb

## 2016-10-16 DIAGNOSIS — Z113 Encounter for screening for infections with a predominantly sexual mode of transmission: Secondary | ICD-10-CM | POA: Insufficient documentation

## 2016-10-16 DIAGNOSIS — Z01419 Encounter for gynecological examination (general) (routine) without abnormal findings: Secondary | ICD-10-CM | POA: Insufficient documentation

## 2016-10-16 DIAGNOSIS — R519 Headache, unspecified: Secondary | ICD-10-CM

## 2016-10-16 DIAGNOSIS — Z3480 Encounter for supervision of other normal pregnancy, unspecified trimester: Secondary | ICD-10-CM | POA: Insufficient documentation

## 2016-10-16 DIAGNOSIS — O98312 Other infections with a predominantly sexual mode of transmission complicating pregnancy, second trimester: Secondary | ICD-10-CM

## 2016-10-16 DIAGNOSIS — R8271 Bacteriuria: Secondary | ICD-10-CM

## 2016-10-16 DIAGNOSIS — O0932 Supervision of pregnancy with insufficient antenatal care, second trimester: Secondary | ICD-10-CM

## 2016-10-16 DIAGNOSIS — O0930 Supervision of pregnancy with insufficient antenatal care, unspecified trimester: Secondary | ICD-10-CM

## 2016-10-16 DIAGNOSIS — R51 Headache: Secondary | ICD-10-CM

## 2016-10-16 DIAGNOSIS — O09299 Supervision of pregnancy with other poor reproductive or obstetric history, unspecified trimester: Secondary | ICD-10-CM

## 2016-10-16 DIAGNOSIS — A6009 Herpesviral infection of other urogenital tract: Secondary | ICD-10-CM

## 2016-10-16 DIAGNOSIS — A6004 Herpesviral vulvovaginitis: Secondary | ICD-10-CM

## 2016-10-16 DIAGNOSIS — O26899 Other specified pregnancy related conditions, unspecified trimester: Secondary | ICD-10-CM

## 2016-10-16 DIAGNOSIS — O219 Vomiting of pregnancy, unspecified: Secondary | ICD-10-CM

## 2016-10-16 LAB — POCT URINALYSIS DIPSTICK
Bilirubin, UA: NEGATIVE
Blood, UA: NEGATIVE
GLUCOSE UA: NEGATIVE
Ketones, UA: NEGATIVE
Nitrite, UA: NEGATIVE
Protein, UA: NEGATIVE
SPEC GRAV UA: 1.01
UROBILINOGEN UA: NEGATIVE
pH, UA: 7.5

## 2016-10-16 MED ORDER — ONDANSETRON HCL 8 MG PO TABS: 8.0000 mg | ORAL_TABLET | Freq: Three times a day (TID) | ORAL | 2 refills | Status: DC | PRN

## 2016-10-16 MED ORDER — BUTALBITAL-APAP-CAFFEINE 50-325-40 MG PO TABS: 1.0000 | ORAL_TABLET | Freq: Four times a day (QID) | ORAL | 4 refills | Status: DC | PRN

## 2016-10-16 NOTE — Progress Notes (Signed)
Subjective:    Bridget BostonShacarrah Leach is being seen today for her first obstetrical visit.  This is a planned pregnancy. She is at 465w1d gestation. Her obstetrical history is significant for pre-eclampsia and HSV. Relationship with FOB: spouse, living together. Patient does intend to breast feed. Pregnancy history fully reviewed.  The information documented in the HPI was reviewed and verified.  Menstrual History: OB History    Gravida Para Term Preterm AB Living   4 1   1 1 1    SAB TAB Ectopic Multiple Live Births     1   0 1       Patient's last menstrual period was 06/25/2016 (approximate).    Past Medical History:  Diagnosis Date  . Chlamydia 06/24/15  . Herpes genitalia   . Sickle cell trait carrier 12/22/2012   Hgb AS    Past Surgical History:  Procedure Laterality Date  . DENTAL SURGERY    . DILATION AND CURETTAGE OF UTERUS       (Not in a hospital admission) No Known Allergies  Social History  Substance Use Topics  . Smoking status: Never Smoker  . Smokeless tobacco: Never Used  . Alcohol use No    Family History  Problem Relation Age of Onset  . Diabetes Maternal Grandmother   . Hypertension Maternal Grandmother   . Cancer Maternal Grandmother     leukemia  . Diabetes Maternal Grandfather   . Hypertension Maternal Grandfather      Review of Systems Constitutional: negative for weight loss Gastrointestinal: negative for vomiting Genitourinary:negative for genital lesions and vaginal discharge and dysuria Musculoskeletal:negative for back pain Behavioral/Psych: negative for abusive relationship, depression, illegal drug usage and tobacco use    Objective:    BP 108/73   Pulse 83   Wt 164 lb (74.4 kg)   LMP 06/25/2016 (Approximate)   Breastfeeding? Unknown   BMI 27.29 kg/m  General Appearance:    Alert, cooperative, no distress, appears stated age  Head:    Normocephalic, without obvious abnormality, atraumatic  Eyes:    PERRL, conjunctiva/corneas  clear, EOM's intact, fundi    benign, both eyes  Ears:    Normal TM's and external ear canals, both ears  Nose:   Nares normal, septum midline, mucosa normal, no drainage    or sinus tenderness  Throat:   Lips, mucosa, and tongue normal; teeth and gums normal  Neck:   Supple, symmetrical, trachea midline, no adenopathy;    thyroid:  no enlargement/tenderness/nodules; no carotid   bruit or JVD  Back:     Symmetric, no curvature, ROM normal, no CVA tenderness  Lungs:     Clear to auscultation bilaterally, respirations unlabored  Chest Wall:    No tenderness or deformity   Heart:    Regular rate and rhythm, S1 and S2 normal, no murmur, rub   or gallop  Breast Exam:    No tenderness, masses, or nipple abnormality  Abdomen:     Soft, non-tender, bowel sounds active all four quadrants,    no masses, no organomegaly  Genitalia:    Normal female without lesion, discharge or tenderness  Extremities:   Extremities normal, atraumatic, no cyanosis or edema  Pulses:   2+ and symmetric all extremities  Skin:   Skin color, texture, turgor normal, no rashes or lesions  Lymph nodes:   Cervical, supraclavicular, and axillary nodes normal  Neurologic:   CNII-XII intact, normal strength, sensation and reflexes    throughout  Cervix:   1 cm dilated, long, ballotable, midline. Friable on exam.  TUVS ordered.    FHR: 150 by doppler.                         FH: 22 cm.     Lab Review Urine pregnancy test Labs reviewed yes Radiologic studies reviewed yes Assessment:    Pregnancy at [redacted]w[redacted]d weeks   Late to care  History of preeclampsia in prior pregnancy  H/O herpes Plan:    Decreased work hours to 8.  Start on baby ASA Prenatal vitamins.  Counseling provided regarding continued use of seat belts, cessation of alcohol consumption, smoking or use of illicit drugs; infection precautions i.e., influenza/TDAP immunizations, toxoplasmosis,CMV, parvovirus, listeria and varicella; workplace safety,  exercise during pregnancy; routine dental care, safe medications, sexual activity, hot tubs, saunas, pools, travel, caffeine use, fish and methlymercury, potential toxins, hair treatments, varicose veins Weight gain recommendations per IOM guidelines reviewed: underweight/BMI< 18.5--> gain 28 - 40 lbs; normal weight/BMI 18.5 - 24.9--> gain 25 - 35 lbs; overweight/BMI 25 - 29.9--> gain 15 - 25 lbs; obese/BMI >30->gain  11 - 20 lbs Problem list reviewed and updated. FIRST/CF mutation testing/NIPT/QUAD SCREEN/fragile X/Ashkenazi Jewish population testing/Spinal muscular atrophy discussed: ordered. Role of ultrasound in pregnancy discussed; fetal survey: ordered. Amniocentesis discussed: not indicated.  Meds ordered this encounter  Medications  . butalbital-acetaminophen-caffeine (FIORICET, ESGIC) 50-325-40 MG tablet    Sig: Take 1-2 tablets by mouth every 6 (six) hours as needed.    Dispense:  45 tablet    Refill:  4  . ondansetron (ZOFRAN) 8 MG tablet    Sig: Take 1 tablet (8 mg total) by mouth every 8 (eight) hours as needed for nausea or vomiting.    Dispense:  40 tablet    Refill:  2   Orders Placed This Encounter  Procedures  . Culture, OB Urine  . [place] MFM OB COMP + 14 WK    Standing Status:   Future    Standing Expiration Date:   12/14/2017    Order Specific Question:   Reason for Exam (SYMPTOM  OR DIAGNOSIS REQUIRED)    Answer:   fetal anatomy scan    Order Specific Question:   Preferred imaging location?    Answer:   [facility]-Ultrasound  . US OB Transvaginal    Standing Status:   Future    Standing Expiration Date:   12/14/2017    Order Specific Question:   Reason for Exam (SYMPTOM  OR DIAGNOSIS REQUIRED)    Answer:   ?funneling, preterm dilation    Order Specific Question:   Preferred imaging location?    Answer:   [facility]-Ultrasound  . Hemoglobinopathy evaluation  . VITAMIN D 25 Hydroxy (Vit-D Deficiency, Fractures)  . Varicella zoster antibody, IgG  . Prenatal Profile I  . Obstetric  Panel, Including HIV  . ToxASSURE Select 13 (MW), Urine  . POCT urinalysis dipstick    Follow up in 4 weeks. 50% of 30 min visit spent on counseling and coordination of care.

## 2016-10-17 LAB — CERVICOVAGINAL ANCILLARY ONLY
BACTERIAL VAGINITIS: NEGATIVE
Candida vaginitis: POSITIVE — AB
Chlamydia: NEGATIVE
NEISSERIA GONORRHEA: NEGATIVE
Trichomonas: POSITIVE — AB

## 2016-10-17 NOTE — Addendum Note (Signed)
Addended by: Marya LandryFOSTER, Alanna Storti D on: 10/17/2016 09:33 AM   Modules accepted: Orders

## 2016-10-18 ENCOUNTER — Other Ambulatory Visit: Payer: Self-pay | Admitting: Certified Nurse Midwife

## 2016-10-18 ENCOUNTER — Encounter: Payer: Self-pay | Admitting: Certified Nurse Midwife

## 2016-10-18 DIAGNOSIS — O23592 Infection of other part of genital tract in pregnancy, second trimester: Principal | ICD-10-CM

## 2016-10-18 DIAGNOSIS — B373 Candidiasis of vulva and vagina: Secondary | ICD-10-CM

## 2016-10-18 DIAGNOSIS — A5901 Trichomonal vulvovaginitis: Secondary | ICD-10-CM

## 2016-10-18 DIAGNOSIS — B3731 Acute candidiasis of vulva and vagina: Secondary | ICD-10-CM

## 2016-10-18 LAB — CYTOLOGY - PAP: DIAGNOSIS: NEGATIVE

## 2016-10-18 MED ORDER — FLUCONAZOLE 150 MG PO TABS: 150.0000 mg | ORAL_TABLET | Freq: Once | ORAL | 0 refills | Status: AC

## 2016-10-18 MED ORDER — METRONIDAZOLE 500 MG PO TABS: 2000.0000 mg | ORAL_TABLET | Freq: Once | ORAL | 0 refills | Status: AC

## 2016-10-19 LAB — OBSTETRIC PANEL, INCLUDING HIV
ANTIBODY SCREEN: NEGATIVE
BASOS ABS: 0 10*3/uL (ref 0.0–0.2)
BASOS: 0 %
EOS (ABSOLUTE): 0.1 10*3/uL (ref 0.0–0.4)
EOS: 1 %
HEMATOCRIT: 32.1 % — AB (ref 34.0–46.6)
HEMOGLOBIN: 10.5 g/dL — AB (ref 11.1–15.9)
HEP B S AG: NEGATIVE
HIV SCREEN 4TH GENERATION: NONREACTIVE
Immature Grans (Abs): 0 10*3/uL (ref 0.0–0.1)
Immature Granulocytes: 0 %
LYMPHS ABS: 1.7 10*3/uL (ref 0.7–3.1)
Lymphs: 19 %
MCH: 24.8 pg — AB (ref 26.6–33.0)
MCHC: 32.7 g/dL (ref 31.5–35.7)
MCV: 76 fL — ABNORMAL LOW (ref 79–97)
MONOCYTES: 8 %
Monocytes Absolute: 0.7 10*3/uL (ref 0.1–0.9)
Neutrophils Absolute: 6.4 10*3/uL (ref 1.4–7.0)
Neutrophils: 72 %
Platelets: 163 10*3/uL (ref 150–379)
RBC: 4.24 x10E6/uL (ref 3.77–5.28)
RDW: 15.9 % — ABNORMAL HIGH (ref 12.3–15.4)
RH TYPE: POSITIVE
RPR: NONREACTIVE
RUBELLA: 13.5 {index} (ref >0.99)
WBC: 8.9 10*3/uL (ref 3.4–10.8)

## 2016-10-19 LAB — HEMOGLOBINOPATHY EVALUATION
HEMOGLOBIN F QUANTITATION: 0 % (ref 0.0–2.0)
HGB A: 59.6 % — AB (ref 96.4–98.8)
HGB C: 0 %
HGB S: 36.9 % — AB
HGB VARIANT: 0 %
Hemoglobin A2 Quantitation: 3.5 % — ABNORMAL HIGH (ref 1.8–3.2)

## 2016-10-19 LAB — VITAMIN D 25 HYDROXY (VIT D DEFICIENCY, FRACTURES): Vit D, 25-Hydroxy: 15.6 ng/mL — ABNORMAL LOW (ref 30.0–100.0)

## 2016-10-19 LAB — VARICELLA ZOSTER ANTIBODY, IGG: VARICELLA: 2752 {index} (ref >165)

## 2016-10-20 LAB — URINE CULTURE, OB REFLEX

## 2016-10-20 LAB — CULTURE, OB URINE

## 2016-10-21 ENCOUNTER — Other Ambulatory Visit: Payer: Self-pay | Admitting: Certified Nurse Midwife

## 2016-10-21 ENCOUNTER — Encounter: Payer: Self-pay | Admitting: Certified Nurse Midwife

## 2016-10-21 DIAGNOSIS — O99012 Anemia complicating pregnancy, second trimester: Secondary | ICD-10-CM

## 2016-10-21 DIAGNOSIS — R7989 Other specified abnormal findings of blood chemistry: Secondary | ICD-10-CM | POA: Insufficient documentation

## 2016-10-21 DIAGNOSIS — O99019 Anemia complicating pregnancy, unspecified trimester: Secondary | ICD-10-CM | POA: Insufficient documentation

## 2016-10-21 DIAGNOSIS — Z3480 Encounter for supervision of other normal pregnancy, unspecified trimester: Secondary | ICD-10-CM

## 2016-10-21 MED ORDER — VITAMIN D (ERGOCALCIFEROL) 1.25 MG (50000 UNIT) PO CAPS: 50000.0000 [IU] | ORAL_CAPSULE | ORAL | 2 refills | Status: DC

## 2016-10-22 ENCOUNTER — Other Ambulatory Visit: Payer: Self-pay | Admitting: Certified Nurse Midwife

## 2016-10-22 LAB — TOXASSURE SELECT 13 (MW), URINE

## 2016-10-24 ENCOUNTER — Other Ambulatory Visit: Payer: Self-pay | Admitting: Certified Nurse Midwife

## 2016-10-24 ENCOUNTER — Ambulatory Visit (HOSPITAL_COMMUNITY)
Admission: RE | Admit: 2016-10-24 | Discharge: 2016-10-24 | Disposition: A | Discharge status: Home / Self Care | Payer: Medicaid Other | Source: Ambulatory Visit | Attending: Certified Nurse Midwife | Admitting: Certified Nurse Midwife

## 2016-10-24 DIAGNOSIS — Z3689 Encounter for other specified antenatal screening: Secondary | ICD-10-CM | POA: Insufficient documentation

## 2016-10-24 DIAGNOSIS — Z3480 Encounter for supervision of other normal pregnancy, unspecified trimester: Secondary | ICD-10-CM

## 2016-10-24 DIAGNOSIS — Z3A23 23 weeks gestation of pregnancy: Secondary | ICD-10-CM | POA: Insufficient documentation

## 2016-11-13 ENCOUNTER — Other Ambulatory Visit (HOSPITAL_COMMUNITY)
Admission: RE | Admit: 2016-11-13 | Discharge: 2016-11-13 | Disposition: A | Discharge status: Home / Self Care | Payer: Medicaid Other | Source: Ambulatory Visit | Attending: Certified Nurse Midwife | Admitting: Certified Nurse Midwife

## 2016-11-13 ENCOUNTER — Ambulatory Visit (INDEPENDENT_AMBULATORY_CARE_PROVIDER_SITE_OTHER): Payer: Medicaid Other | Admitting: Certified Nurse Midwife

## 2016-11-13 VITALS — BP 110/73 | HR 96 | Wt 169.6 lb

## 2016-11-13 DIAGNOSIS — Z3480 Encounter for supervision of other normal pregnancy, unspecified trimester: Secondary | ICD-10-CM

## 2016-11-13 DIAGNOSIS — O0930 Supervision of pregnancy with insufficient antenatal care, unspecified trimester: Secondary | ICD-10-CM

## 2016-11-13 DIAGNOSIS — F1210 Cannabis abuse, uncomplicated: Secondary | ICD-10-CM

## 2016-11-13 DIAGNOSIS — A5901 Trichomonal vulvovaginitis: Secondary | ICD-10-CM

## 2016-11-13 DIAGNOSIS — O23592 Infection of other part of genital tract in pregnancy, second trimester: Secondary | ICD-10-CM

## 2016-11-13 DIAGNOSIS — A6004 Herpesviral vulvovaginitis: Secondary | ICD-10-CM

## 2016-11-13 DIAGNOSIS — Z113 Encounter for screening for infections with a predominantly sexual mode of transmission: Secondary | ICD-10-CM | POA: Insufficient documentation

## 2016-11-13 DIAGNOSIS — R8271 Bacteriuria: Secondary | ICD-10-CM

## 2016-11-13 DIAGNOSIS — D649 Anemia, unspecified: Secondary | ICD-10-CM

## 2016-11-13 DIAGNOSIS — O0932 Supervision of pregnancy with insufficient antenatal care, second trimester: Secondary | ICD-10-CM

## 2016-11-13 DIAGNOSIS — O99012 Anemia complicating pregnancy, second trimester: Secondary | ICD-10-CM

## 2016-11-13 DIAGNOSIS — O98312 Other infections with a predominantly sexual mode of transmission complicating pregnancy, second trimester: Secondary | ICD-10-CM

## 2016-11-13 DIAGNOSIS — O09292 Supervision of pregnancy with other poor reproductive or obstetric history, second trimester: Secondary | ICD-10-CM

## 2016-11-13 DIAGNOSIS — O09299 Supervision of pregnancy with other poor reproductive or obstetric history, unspecified trimester: Secondary | ICD-10-CM

## 2016-11-13 DIAGNOSIS — R7989 Other specified abnormal findings of blood chemistry: Secondary | ICD-10-CM

## 2016-11-13 MED ORDER — ASPIRIN 81 MG PO CHEW: 81.0000 mg | CHEWABLE_TABLET | Freq: Every day | ORAL | 5 refills | Status: DC

## 2016-11-13 MED ORDER — CITRANATAL BLOOM 90-1 MG PO TABS: 1.0000 | ORAL_TABLET | Freq: Every day | ORAL | 12 refills | Status: DC

## 2016-11-13 NOTE — Progress Notes (Signed)
   PRENATAL VISIT NOTE  Subjective:  Bridget Leach is a 26 y.o. [phone] at 1080w1d being seen today for ongoing prenatal care.  She is currently monitored for the following issues for this low-risk pregnancy and has Sickle cell trait carrier; Hx of preeclampsia, prior pregnancy, currently pregnant; Genital herpes; Mild tetrahydrocannabinol (THC) abuse; Anemia; Herpes simplex type 1 infection; GBS bacteriuria; Supervision of other normal pregnancy, antepartum; Late prenatal care; Trichomonal vaginitis during pregnancy in second trimester; Low vitamin D level; and Anemia of pregnancy on her problem list.  Patient reports backache, no bleeding, no contractions, no cramping and no leaking.  Contractions: Not present. Vag. Bleeding: None.  Movement: Present. Denies leaking of fluid.   The following portions of the patient's history were reviewed and updated as appropriate: allergies, current medications, past family history, past medical history, past social history, past surgical history and problem list. Problem list updated.  Objective:   Vitals:   11/13/16 0948  BP: 110/73  Pulse: 96  Weight: 169 lb 9.6 oz (76.9 kg)    Fetal Status: Fetal Heart Rate (bpm): 131 Fundal Height: 26 cm Movement: Present     General:  Alert, oriented and cooperative. Patient is in no acute distress.  Skin: Skin is warm and dry. No rash noted.   Cardiovascular: Normal heart rate noted  Respiratory: Normal respiratory effort, no problems with respiration noted  Abdomen: Soft, gravid, appropriate for gestational age. Pain/Pressure: Absent     Pelvic:  Cervical exam deferred        Extremities: Normal range of motion.  Edema: None  Mental Status: Normal mood and affect. Normal behavior. Normal judgment and thought content.   Assessment and Plan:  Pregnancy: [id number] at 8280w1d  1. Supervision of other normal pregnancy, antepartum       - Cervicovaginal ancillary only  2. Herpes simplex vulvovaginitis  Valtrex @36  weeks  3. Trichomonal vaginitis during pregnancy in second trimester      TOC today - Cervicovaginal ancillary only  4. Mild tetrahydrocannabinol (THC) abuse     Not currently using.   5. Low vitamin D level    Taking weekly vitamin D.   6. Late prenatal care     Initiated care @22wks   7. Hx of preeclampsia, prior pregnancy, currently pregnant     Taking baby ASA  8. GBS bacteriuria     PCN for delivery  9. Anemia during pregnancy in second trimester     Discussed dietary needs for increased iron intake.  Patient verbalized understanding.   Preterm labor symptoms and general obstetric precautions including but not limited to vaginal bleeding, contractions, leaking of fluid and fetal movement were reviewed in detail with the patient. Please refer to After Visit Summary for other counseling recommendations.  Return in about 2 weeks (around 11/27/2016) for ROB, 2 hr OGTT.   Roe Coombsachelle A Twinkle Sockwell, CNM

## 2016-11-13 NOTE — Progress Notes (Signed)
Patient on feet all day- she is having back pain.

## 2016-11-14 LAB — CERVICOVAGINAL ANCILLARY ONLY
Chlamydia: NEGATIVE
Neisseria Gonorrhea: NEGATIVE
TRICH (WINDOWPATH): NEGATIVE

## 2016-11-27 ENCOUNTER — Encounter: Payer: Medicaid Other | Admitting: Certified Nurse Midwife

## 2016-11-27 ENCOUNTER — Other Ambulatory Visit: Payer: Medicaid Other

## 2016-12-05 ENCOUNTER — Ambulatory Visit (INDEPENDENT_AMBULATORY_CARE_PROVIDER_SITE_OTHER): Payer: Medicaid Other | Admitting: Certified Nurse Midwife

## 2016-12-05 ENCOUNTER — Encounter: Payer: Self-pay | Admitting: Certified Nurse Midwife

## 2016-12-05 ENCOUNTER — Other Ambulatory Visit: Payer: Medicaid Other

## 2016-12-05 VITALS — BP 109/71 | HR 88 | Temp 97.1°F | Wt 172.8 lb

## 2016-12-05 DIAGNOSIS — O0930 Supervision of pregnancy with insufficient antenatal care, unspecified trimester: Secondary | ICD-10-CM

## 2016-12-05 DIAGNOSIS — O23592 Infection of other part of genital tract in pregnancy, second trimester: Secondary | ICD-10-CM

## 2016-12-05 DIAGNOSIS — A5901 Trichomonal vulvovaginitis: Secondary | ICD-10-CM

## 2016-12-05 DIAGNOSIS — O09299 Supervision of pregnancy with other poor reproductive or obstetric history, unspecified trimester: Secondary | ICD-10-CM

## 2016-12-05 DIAGNOSIS — E559 Vitamin D deficiency, unspecified: Secondary | ICD-10-CM

## 2016-12-05 DIAGNOSIS — Z23 Encounter for immunization: Secondary | ICD-10-CM | POA: Diagnosis not present

## 2016-12-05 DIAGNOSIS — R7989 Other specified abnormal findings of blood chemistry: Secondary | ICD-10-CM

## 2016-12-05 DIAGNOSIS — Z3480 Encounter for supervision of other normal pregnancy, unspecified trimester: Secondary | ICD-10-CM

## 2016-12-05 DIAGNOSIS — F1210 Cannabis abuse, uncomplicated: Secondary | ICD-10-CM

## 2016-12-05 DIAGNOSIS — R8271 Bacteriuria: Secondary | ICD-10-CM

## 2016-12-05 DIAGNOSIS — O0933 Supervision of pregnancy with insufficient antenatal care, third trimester: Secondary | ICD-10-CM

## 2016-12-05 DIAGNOSIS — O09293 Supervision of pregnancy with other poor reproductive or obstetric history, third trimester: Secondary | ICD-10-CM

## 2016-12-05 DIAGNOSIS — A6004 Herpesviral vulvovaginitis: Secondary | ICD-10-CM

## 2016-12-05 NOTE — Progress Notes (Signed)
   PRENATAL VISIT NOTE  Subjective:  Bridget Leach is a 26 y.o. [id number] at 4233w2d being seen today for ongoing prenatal care.  She is currently monitored for the following issues for this low-risk pregnancy and has Sickle cell trait carrier; Hx of preeclampsia, prior pregnancy, currently pregnant; Genital herpes; Mild tetrahydrocannabinol (THC) abuse; Anemia; Herpes simplex type 1 infection; GBS bacteriuria; Supervision of other normal pregnancy, antepartum; Late prenatal care; Trichomonal vaginitis during pregnancy in second trimester; Low vitamin D level; and Anemia of pregnancy on her problem list.  Patient reports no complaints.  Contractions: Not present. Vag. Bleeding: None.  Movement: Present. Denies leaking of fluid.   The following portions of the patient's history were reviewed and updated as appropriate: allergies, current medications, past family history, past medical history, past social history, past surgical history and problem list. Problem list updated.  Objective:   Vitals:   12/05/16 0915  BP: 109/71  Pulse: 88  Temp: 97.1 F (36.2 C)  Weight: 172 lb 12.8 oz (78.4 kg)    Fetal Status: Fetal Heart Rate (bpm): 145 Fundal Height: 30 cm Movement: Present     General:  Alert, oriented and cooperative. Patient is in no acute distress.  Skin: Skin is warm and dry. No rash noted.   Cardiovascular: Normal heart rate noted  Respiratory: Normal respiratory effort, no problems with respiration noted  Abdomen: Soft, gravid, appropriate for gestational age. Pain/Pressure: Absent     Pelvic:  Cervical exam deferred        Extremities: Normal range of motion.  Edema: Trace  Mental Status: Normal mood and affect. Normal behavior. Normal judgment and thought content.   Assessment and Plan:  Pregnancy: [id number] at 5933w2d  1. Supervision of other normal pregnancy, antepartum      Doing well - Tdap vaccine greater than or equal to 24yo IM - Glucose Tolerance, 2 Hours w/1 Hour - HIV  antibody - RPR - CBC - MaterniT21 PLUS Core+SCA  2. Hx of preeclampsia, prior pregnancy, currently pregnant      Taking baby ASA  3. GBS bacteriuria      PCN for labor  4. Late prenatal care     Started care @22wks   5. Mild tetrahydrocannabinol (THC) abuse       6. Low vitamin D level     Taking weekly vitamin D  7. Herpes simplex vulvovaginitis     Valtrex @36  wks  8. Trichomonal vaginitis during pregnancy in second trimester     TOC negative 11/12/16  Preterm labor symptoms and general obstetric precautions including but not limited to vaginal bleeding, contractions, leaking of fluid and fetal movement were reviewed in detail with the patient. Please refer to After Visit Summary for other counseling recommendations.  Return in about 2 weeks (around 12/19/2016) for ROB.   Roe Coombsachelle A Biridiana Twardowski, CNM

## 2016-12-05 NOTE — Progress Notes (Signed)
Patient reports good fetal movement, denies pain/contractions. Patient has not picked up vitamin D rx yet.

## 2016-12-06 LAB — GLUCOSE TOLERANCE, 2 HOURS W/ 1HR
GLUCOSE, 1 HOUR: 77 mg/dL (ref 65–179)
GLUCOSE, 2 HOUR: 82 mg/dL (ref 65–152)
GLUCOSE, FASTING: 68 mg/dL (ref 65–91)

## 2016-12-06 LAB — CBC
Hematocrit: 29.4 % — ABNORMAL LOW (ref 34.0–46.6)
Hemoglobin: 9.6 g/dL — ABNORMAL LOW (ref 11.1–15.9)
MCH: 24.3 pg — ABNORMAL LOW (ref 26.6–33.0)
MCHC: 32.7 g/dL (ref 31.5–35.7)
MCV: 74 fL — AB (ref 79–97)
PLATELETS: 152 10*3/uL (ref 150–379)
RBC: 3.95 x10E6/uL (ref 3.77–5.28)
RDW: 16.4 % — AB (ref 12.3–15.4)
WBC: 7.7 10*3/uL (ref 3.4–10.8)

## 2016-12-06 LAB — HIV ANTIBODY (ROUTINE TESTING W REFLEX): HIV Screen 4th Generation wRfx: NONREACTIVE

## 2016-12-06 LAB — RPR: RPR: NONREACTIVE

## 2016-12-09 ENCOUNTER — Other Ambulatory Visit: Payer: Self-pay | Admitting: Certified Nurse Midwife

## 2016-12-09 DIAGNOSIS — Z3480 Encounter for supervision of other normal pregnancy, unspecified trimester: Secondary | ICD-10-CM

## 2016-12-09 DIAGNOSIS — O99013 Anemia complicating pregnancy, third trimester: Secondary | ICD-10-CM

## 2016-12-11 LAB — ANEMIA PROFILE B
FERRITIN: 5 ng/mL — AB (ref 15–150)
FOLATE: 13 ng/mL (ref >3.0)
Iron Saturation: 5 % — CL (ref 15–55)
Iron: 30 ug/dL (ref 27–159)
Total Iron Binding Capacity: 577 ug/dL (ref 250–450)
UIBC: 547 ug/dL — ABNORMAL HIGH (ref 131–425)
VITAMIN B 12: 257 pg/mL (ref 232–1245)

## 2016-12-11 LAB — MATERNIT21 PLUS CORE+SCA
Chromosome 13: NEGATIVE
Chromosome 18: NEGATIVE
Chromosome 21: NEGATIVE
Y CHROMOSOME: NOT DETECTED

## 2016-12-11 LAB — SPECIMEN STATUS REPORT

## 2016-12-12 ENCOUNTER — Other Ambulatory Visit: Payer: Self-pay | Admitting: Certified Nurse Midwife

## 2016-12-12 DIAGNOSIS — Z3480 Encounter for supervision of other normal pregnancy, unspecified trimester: Secondary | ICD-10-CM

## 2016-12-12 DIAGNOSIS — O99013 Anemia complicating pregnancy, third trimester: Secondary | ICD-10-CM

## 2016-12-19 ENCOUNTER — Encounter: Payer: Self-pay | Admitting: Certified Nurse Midwife

## 2016-12-19 ENCOUNTER — Ambulatory Visit (INDEPENDENT_AMBULATORY_CARE_PROVIDER_SITE_OTHER): Payer: Medicaid Other | Admitting: Certified Nurse Midwife

## 2016-12-19 VITALS — BP 113/79 | HR 88 | Wt 172.4 lb

## 2016-12-19 DIAGNOSIS — R8271 Bacteriuria: Secondary | ICD-10-CM

## 2016-12-19 DIAGNOSIS — O99013 Anemia complicating pregnancy, third trimester: Secondary | ICD-10-CM

## 2016-12-19 DIAGNOSIS — B009 Herpesviral infection, unspecified: Secondary | ICD-10-CM

## 2016-12-19 DIAGNOSIS — O09299 Supervision of pregnancy with other poor reproductive or obstetric history, unspecified trimester: Secondary | ICD-10-CM

## 2016-12-19 DIAGNOSIS — Z3480 Encounter for supervision of other normal pregnancy, unspecified trimester: Secondary | ICD-10-CM

## 2016-12-19 DIAGNOSIS — O09293 Supervision of pregnancy with other poor reproductive or obstetric history, third trimester: Secondary | ICD-10-CM

## 2016-12-19 DIAGNOSIS — E559 Vitamin D deficiency, unspecified: Secondary | ICD-10-CM

## 2016-12-19 DIAGNOSIS — R7989 Other specified abnormal findings of blood chemistry: Secondary | ICD-10-CM

## 2016-12-19 DIAGNOSIS — O0933 Supervision of pregnancy with insufficient antenatal care, third trimester: Secondary | ICD-10-CM

## 2016-12-19 DIAGNOSIS — O0930 Supervision of pregnancy with insufficient antenatal care, unspecified trimester: Secondary | ICD-10-CM

## 2016-12-19 MED ORDER — MAGIC MOUTHWASH W/LIDOCAINE: 5.0000 mL | Freq: Four times a day (QID) | ORAL | 2 refills | Status: DC | PRN

## 2016-12-19 MED ORDER — ACYCLOVIR 200 MG PO CAPS: 200.0000 mg | ORAL_CAPSULE | Freq: Every day | ORAL | 2 refills | Status: DC

## 2016-12-19 NOTE — Progress Notes (Signed)
    PRENATAL VISIT NOTE  Subjective:  Bridget Leach is a 26 y.o. [phone] at [redacted]w[redacted]d being seen today for ongoing prenatal care.  She is currently monitored for the following issues for this low-risk pregnancy and has Sickle cell trait carrier; Hx of preeclampsia, prior pregnancy, currently pregnant; Genital herpes; Mild tetrahydrocannabinol (THC) abuse; Anemia; Herpes simplex type 1 infection; GBS bacteriuria; Supervision of other normal pregnancy, antepartum; Late prenatal care; Trichomonal vaginitis during pregnancy in second trimester; Low vitamin D level; and Anemia of pregnancy on her problem list.  Patient reports mouth irritation and sores in mouth, and fatigue .  Pt reports working an having to take care of a little one at home. Contractions: Not present.  .  Movement: Present. Denies leaking of fluid.   The following portions of the patient's history were reviewed and updated as appropriate: allergies, current medications, past family history, past medical history, past social history, past surgical history and problem list. Problem list updated.  Objective:   Vitals:   12/19/16 0941  BP: 113/79  Pulse: 88  Weight: 172 lb 6 oz (78.2 kg)    Fetal Status: Fetal Heart Rate (bpm): 142 Fundal Height: 31 cm Movement: Present     General:  Alert, oriented and cooperative. Patient is in no acute distress.  Skin: Skin is warm and dry. No rash noted.   Cardiovascular: Normal heart rate noted  Respiratory: Normal respiratory effort, no problems with respiration noted  Abdomen: Soft, gravid, appropriate for gestational age. Pain/Pressure: Absent     Pelvic:  Cervical exam deferred        Extremities: Normal range of motion.  Edema: None  Mental Status: Normal mood and affect. Normal behavior. Normal judgment and thought content.   Assessment and Plan:  Pregnancy: [id number] at [redacted]w[redacted]d  1. GBS bacteriuria PCN during labor  2. Supervision of other normal pregnancy, antepartum     Doing  well  3. Late prenatal care     NOB  wks  4. Low vitamin D level  Continue taking Vitamin D weekly.  5. Anemia during pregnancy in third trimester     Taking Bloom  6. Herpes simplex type 1 infection     Acyclovir ordered for current outbreak  7. Hx of preeclampsia, prior pregnancy, currently pregnant      Taking baby ASA  Preterm labor symptoms and general obstetric precautions including but not limited to vaginal bleeding, contractions, leaking of fluid and fetal movement were reviewed in detail with the patient. Please refer to After Visit Summary for other counseling recommendations.  Return in about 2 weeks (around 01/02/2017) for ROB.   Roe Coombs, CNM

## 2016-12-26 ENCOUNTER — Encounter: Payer: Self-pay | Admitting: Hematology and Oncology

## 2017-01-03 ENCOUNTER — Encounter: Payer: Medicaid Other | Admitting: Certified Nurse Midwife

## 2017-01-09 ENCOUNTER — Other Ambulatory Visit: Payer: Self-pay | Admitting: Certified Nurse Midwife

## 2017-01-09 ENCOUNTER — Ambulatory Visit (HOSPITAL_BASED_OUTPATIENT_CLINIC_OR_DEPARTMENT_OTHER): Payer: Medicaid Other | Admitting: Hematology and Oncology

## 2017-01-09 ENCOUNTER — Encounter: Payer: Self-pay | Admitting: Hematology and Oncology

## 2017-01-09 DIAGNOSIS — D509 Iron deficiency anemia, unspecified: Secondary | ICD-10-CM | POA: Diagnosis not present

## 2017-01-09 DIAGNOSIS — Z3A35 35 weeks gestation of pregnancy: Secondary | ICD-10-CM

## 2017-01-09 NOTE — Progress Notes (Signed)
[facility] CONSULT NOTE  Patient Care Team: No Pcp Per Patient as PCP - General (General Practice)  CHIEF COMPLAINTS/PURPOSE OF CONSULTATION:   sever iron deficiency anemia  HISTORY OF PRESENTING ILLNESS:  Bridget Leach 26 y.o. female  Who is [redacted] weeks pregnant with her second child and was noted to have profound iron deficiency anemia and severe fatige to the point that she has been sleeping all day. She had been on prenatal vitamins and oral iron replacement therapy for the past 2 months as well. In spite of this her hemoglobin levels and iron levels remained extremely low. She was sent to us for discussion regarding intravenous iron replacement therapy.  MEDICAL HISTORY:  Past Medical History:  Diagnosis Date  . Chlamydia 06/24/15  . Herpes genitalia   . Sickle cell trait carrier 12/22/2012   Hgb AS    SURGICAL HISTORY: Past Surgical History:  Procedure Laterality Date  . DENTAL SURGERY    . DILATION AND CURETTAGE OF UTERUS      SOCIAL HISTORY: Social History   Social History  . Marital status: Single    Spouse name: N/A  . Number of children: N/A  . Years of education: N/A   Occupational History  . Not on file.   Social History Main Topics  . Smoking status: Never Smoker  . Smokeless tobacco: Never Used  . Alcohol use No  . Drug use: No  . Sexual activity: Yes    Birth control/ protection: Condom   Other Topics Concern  . Not on file   Social History Narrative  . No narrative on file    FAMILY HISTORY: Family History  Problem Relation Age of Onset  . Diabetes Maternal Grandmother   . Hypertension Maternal Grandmother   . Cancer Maternal Grandmother     leukemia  . Diabetes Maternal Grandfather   . Hypertension Maternal Grandfather     ALLERGIES:  has No Known Allergies.  MEDICATIONS:  Current Outpatient Prescriptions  Medication Sig Dispense Refill  . aspirin 81 MG chewable tablet Chew 1 tablet (81 mg total) by mouth daily. 30  tablet 5  . butalbital-acetaminophen-caffeine (FIORICET, ESGIC) 50-325-40 MG tablet Take 1-2 tablets by mouth every 6 (six) hours as needed. 45 tablet 4  . magic mouthwash w/lidocaine SOLN Take 5 mLs by mouth 4 (four) times daily as needed for mouth pain. 120 mL 2  . ondansetron (ZOFRAN) 8 MG tablet Take 1 tablet (8 mg total) by mouth every 8 (eight) hours as needed for nausea or vomiting. 40 tablet 2  . Prenatal-DSS-FeCb-FeGl-FA (CITRANATAL BLOOM) 90-1 MG TABS Take 1 tablet by mouth daily. 30 tablet 12  . Vitamin D, Ergocalciferol, (DRISDOL) 50000 units CAPS capsule Take 1 capsule (50,000 Units total) by mouth every 7 (seven) days. 30 capsule 2   No current facility-administered medications for this visit.     REVIEW OF SYSTEMS:   Constitutional: Denies fevers, chills or abnormal night sweats Eyes: Denies blurriness of vision, double vision or watery eyes Ears, nose, mouth, throat, and face: Denies mucositis or sore throat Respiratory: Denies cough, dyspnea or wheezes Cardiovascular: Denies palpitation, chest discomfort or lower extremity swelling Gastrointestinal:   pregnancy Skin: Denies abnormal skin rashes Lymphatics: Denies new lymphadenopathy or easy bruising Neurological:Denies numbness, tingling or new weaknesses Behavioral/Psych: Mood is stable, no new changes   All other systems were reviewed with the patient and are negative.  PHYSICAL EXAMINATION: ECOG PERFORMANCE STATUS: 1 - Symptomatic but completely ambulatory  Vitals:  01/09/17 1325  BP: 125/77  Pulse: 90  Resp: 18  Temp: 98.1 F (36.7 C)   Filed Weights   01/09/17 1325  Weight: 176 lb 14.4 oz (80.2 kg)    GENERAL:alert, no distress and comfortable SKIN: skin color, texture, turgor are normal, no rashes or significant lesions EYES: normal, conjunctiva are pink and non-injected, sclera clear OROPHARYNX:no exudate, no erythema and lips, buccal mucosa, and tongue normal  NECK: supple, thyroid normal size,  non-tender, without nodularity LYMPH:  no palpable lymphadenopathy in the cervical, axillary or inguinal LUNGS: clear to auscultation and percussion with normal breathing effort HEART: regular rate & rhythm and no murmurs and no lower extremity edema ABDOMEN: pregnant Musculoskeletal:no cyanosis of digits and no clubbing  PSYCH: alert & oriented x 3 with fluent speech NEURO: no focal motor/sensory deficits  LABORATORY DATA:  I have reviewed the data as listed Lab Results  Component Value Date   WBC 7.7 12/05/2016   WBC CANCELED 12/05/2016   HGB 11.5 (L) 07/23/2016   HCT 29.4 (L) 12/05/2016   HCT CANCELED 12/05/2016   MCV 74 (L) 12/05/2016   MCV CANCELED 12/05/2016   PLT 152 12/05/2016   PLT CANCELED 12/05/2016   Lab Results  Component Value Date   NA 139 10/16/2015   K 3.7 10/16/2015   CL 102 10/16/2015   CO2 27 10/16/2015    RADIOGRAPHIC STUDIES: I have personally reviewed the radiological reports and agreed with the findings in the report.  ASSESSMENT AND PLAN:  Iron deficiency anemia Profound iron deficiency anemia with pregnancy. Review of blood work 12/05/2016 revealed iron saturation 5%, ferritin 5, TIBC 577, hemoglobin 9.6 and MCV  74, platelets and WBC are normal. Based upon the profound iron deficiency in pregnancy, I did recommend intravenous iron therapy to get her ready for the delivery.  We will plan to administer Venofer with close supervision from maternal-fetal nurse. She will receive 4 doses of IV Venofer start next week. I discussed with her that IV iron can have reactions including allergy reactions as well as anaphylaxis. There could be fetal complications as well. That is why we would need to closely monitor her mother and baby.  Patient understands these risks and is agreeable to proceed with the infusion.  Return to clinic in 3 months recheck her CBC and iron studies.   All questions were answered. The patient knows to call the clinic with any  problems, questions or concerns.    Sabas Sous, MD 01/09/17

## 2017-01-09 NOTE — Assessment & Plan Note (Addendum)
Profound iron deficiency anemia with pregnancy. Review of blood work 12/05/2016 revealed iron saturation 5%, ferritin 5, TIBC 577, hemoglobin 9.6 and MCV  74, platelets and WBC are normal. Based upon the profound iron deficiency in pregnancy, I did recommend intravenous iron therapy to get her ready for the delivery.  We will plan to administer Venofer with close supervision from maternal-fetal nurse. She will receive 4 doses of IV Venofer start next week. I discussed with her that IV iron can have reactions including allergy reactions as well as anaphylaxis. There could be fetal complications as well. That is why we would need to closely monitor her mother and baby.  Patient understands these risks and is agreeable to proceed with the infusion.  Return to clinic in 3 months recheck her CBC and iron studies.

## 2017-01-10 ENCOUNTER — Telehealth: Payer: Self-pay

## 2017-01-10 NOTE — Telephone Encounter (Signed)
Called to notify pt that we are unable to have pt come for iron infusion next week due to no openings in infusion room. [facility]. Spoke with hospital nursing supervisor and was advised that pt may come to maternal admission's unit to get her iron treatments next week, but would depend upon the iron dose and duration of infusion. They are unable to do more than 3hrs of infusion. Per their pharmacy and their iron availability, women's only carry feraheme and not Venofer. Orders will need to be placed in epic by Dr.Gudena. Attempted to call pt ob dr and was unable to reach them due to closed clinic hrs. Will follow up on Monday. Notified Dr.Gudena and is aware. Pt was notified of situation and was appreciative of call and update. Will call pt on Monday to confirm time/date for infusions next week.

## 2017-01-13 ENCOUNTER — Telehealth: Payer: Self-pay | Admitting: *Deleted

## 2017-01-13 ENCOUNTER — Telehealth: Payer: Self-pay | Admitting: Obstetrics & Gynecology

## 2017-01-13 ENCOUNTER — Telehealth: Payer: Self-pay | Admitting: Advanced Practice Midwife

## 2017-01-13 NOTE — Progress Notes (Unsigned)
[facility] [place] to let them know that [facility]. Was advised to reach out to pt attending provide (Dr.James Debroah Looprnold) at the hospital. Spoke with md to notify him of pt needing iron infusion this week. Pending call back response from Dr.Arnold on when we can have pt come in for infusion.   1125- Received call b

## 2017-01-13 NOTE — Telephone Encounter (Signed)
CNM was notified by Georgian Coonya Buckson that this [facility] patient was supposed to start Venofer infusions for severely symptomatic iron deficiency anemia per Dr. Pamelia HoitGudena (Heme/Onc), but that there [facility] this [facility]. She asked about possibility of infusion in MAU. Per review of chart, several phone calls have been made RE: this issue. It was unclear if Venofer could be given in MAU due to unknown availability and possible long infusion time. Dr. Debroah LoopArnold had said that the infusion could be put ion hold until [facility], but CNM was unsure if he know that she hadn't even gotten first dose yet. CNM contacted Dr. Pamelia HoitGudena who said it would be fine to use Feraheme 510 mg Q week x 2 weeks instead of Venofer. Discussed w/ Dr. Debroah LoopArnold. Will call pt to arrange MAU Feraheme infusions.

## 2017-01-13 NOTE — Telephone Encounter (Signed)
Telephone call to patient to arrange appointment for her to be seen this week.  She did not keep her last OB appointment.  Dr. Debroah LoopArnold had received call earlier from Cancer center regarding infusions weekly for duration of pregnancy.  I called [facility] center and requested infusions be put on hold until we see patient this week per Dr. Olivia MackieArnold's request.  Cancer center notified.   Will continue to try and reach patient.  She does have active My Chart account.

## 2017-01-13 NOTE — Telephone Encounter (Signed)
"  This is Bridget Smith PA calling for information on this patient who we did not know has not started iron infusions yet.  Patients generally receive iron at the cancer center.  Dr.Arnold needs to know what dose is needed.  We do not have this medicine on hand but can get it.  Can the patient receive Bridget Leach or is there a reason provider wants Venofer."  Provided cell number for Dr. Pamelia HoitGudena.

## 2017-01-13 NOTE — Telephone Encounter (Signed)
Called PA back and lvm with direct desk nurse number.

## 2017-01-14 ENCOUNTER — Telehealth: Payer: Self-pay | Admitting: Hematology and Oncology

## 2017-01-14 NOTE — Telephone Encounter (Signed)
Per response from [facility] desk nurse re 5/4 scheduling request for IV iron the week of 5/7, she [facility]

## 2017-01-15 ENCOUNTER — Ambulatory Visit (INDEPENDENT_AMBULATORY_CARE_PROVIDER_SITE_OTHER): Payer: Medicaid Other | Admitting: Obstetrics & Gynecology

## 2017-01-15 ENCOUNTER — Other Ambulatory Visit (HOSPITAL_COMMUNITY)
Admission: RE | Admit: 2017-01-15 | Discharge: 2017-01-15 | Disposition: A | Discharge status: Home / Self Care | Payer: Medicaid Other | Source: Ambulatory Visit | Attending: Obstetrics & Gynecology | Admitting: Obstetrics & Gynecology

## 2017-01-15 VITALS — BP 112/72 | HR 88 | Wt 177.8 lb

## 2017-01-15 DIAGNOSIS — Z113 Encounter for screening for infections with a predominantly sexual mode of transmission: Secondary | ICD-10-CM | POA: Diagnosis not present

## 2017-01-15 DIAGNOSIS — Z3480 Encounter for supervision of other normal pregnancy, unspecified trimester: Secondary | ICD-10-CM | POA: Diagnosis present

## 2017-01-15 DIAGNOSIS — O09293 Supervision of pregnancy with other poor reproductive or obstetric history, third trimester: Secondary | ICD-10-CM

## 2017-01-15 DIAGNOSIS — O99013 Anemia complicating pregnancy, third trimester: Secondary | ICD-10-CM

## 2017-01-15 DIAGNOSIS — O09299 Supervision of pregnancy with other poor reproductive or obstetric history, unspecified trimester: Secondary | ICD-10-CM

## 2017-01-15 DIAGNOSIS — Z3483 Encounter for supervision of other normal pregnancy, third trimester: Secondary | ICD-10-CM

## 2017-01-15 MED ORDER — FLINTSTONES COMPLETE 60 MG PO CHEW: 2.0000 | CHEWABLE_TABLET | Freq: Every day | ORAL | 1 refills | Status: DC

## 2017-01-15 MED ORDER — FERROUS SULFATE 325 (65 FE) MG PO TABS: 325.0000 mg | ORAL_TABLET | Freq: Every day | ORAL | 3 refills | Status: DC

## 2017-01-15 NOTE — Patient Instructions (Signed)
Third Trimester of Pregnancy The third trimester is from week 28 through week 40 (months 7 through 9). The third trimester is a time when the unborn baby (fetus) is growing rapidly. At the end of the ninth month, the fetus is about 20 inches in length and weighs 6-10 pounds. Body changes during your third trimester Your body will continue to go through many changes during pregnancy. The changes vary from woman to woman. During the third trimester:  Your weight will continue to increase. You can expect to gain 25-35 pounds (11-16 kg) by the end of the pregnancy.  You may begin to get stretch marks on your hips, abdomen, and breasts.  You may urinate more often because the fetus is moving lower into your pelvis and pressing on your bladder.  You may develop or continue to have heartburn. This is caused by increased hormones that slow down muscles in the digestive tract.  You may develop or continue to have constipation because increased hormones slow digestion and cause the muscles that push waste through your intestines to relax.  You may develop hemorrhoids. These are swollen veins (varicose veins) in the rectum that can itch or be painful.  You may develop swollen, bulging veins (varicose veins) in your legs.  You may have increased body aches in the pelvis, back, or thighs. This is due to weight gain and increased hormones that are relaxing your joints.  You may have changes in your hair. These can include thickening of your hair, rapid growth, and changes in texture. Some women also have hair loss during or after pregnancy, or hair that feels dry or thin. Your hair will most likely return to normal after your baby is born.  Your breasts will continue to grow and they will continue to become tender. A yellow fluid (colostrum) may leak from your breasts. This is the first milk you are producing for your baby.  Your belly button may stick out.  You may notice more swelling in your hands,  face, or ankles.  You may have increased tingling or numbness in your hands, arms, and legs. The skin on your belly may also feel numb.  You may feel short of breath because of your expanding uterus.  You may have more problems sleeping. This can be caused by the size of your belly, increased need to urinate, and an increase in your body's metabolism.  You may notice the fetus "dropping," or moving lower in your abdomen (lightening).  You may have increased vaginal discharge.  You may notice your joints feel loose and you may have pain around your pelvic bone.  What to expect at prenatal visits You will have prenatal exams every 2 weeks until week 36. Then you will have weekly prenatal exams. During a routine prenatal visit:  You will be weighed to make sure you and the baby are growing normally.  Your blood pressure will be taken.  Your abdomen will be measured to track your baby's growth.  The fetal heartbeat will be listened to.  Any test results from the previous visit will be discussed.  You may have a cervical check near your due date to see if your cervix has softened or thinned (effaced).  You will be tested for Group B streptococcus. This happens between 35 and 37 weeks.  Your health care provider may ask you:  What your birth plan is.  How you are feeling.  If you are feeling the baby move.  If you have had   any abnormal symptoms, such as leaking fluid, bleeding, severe headaches, or abdominal cramping.  If you are using any tobacco products, including cigarettes, chewing tobacco, and electronic cigarettes.  If you have any questions.  Other tests or screenings that may be performed during your third trimester include:  Blood tests that check for low iron levels (anemia).  Fetal testing to check the health, activity level, and growth of the fetus. Testing is done if you have certain medical conditions or if there are problems during the  pregnancy.  Nonstress test (NST). This test checks the health of your baby to make sure there are no signs of problems, such as the baby not getting enough oxygen. During this test, a belt is placed around your belly. The baby is made to move, and its heart rate is monitored during movement.  What is false labor? False labor is a condition in which you feel small, irregular tightenings of the muscles in the womb (contractions) that usually go away with rest, changing position, or drinking water. These are called Braxton Hicks contractions. Contractions may last for hours, days, or even weeks before true labor sets in. If contractions come at regular intervals, become more frequent, increase in intensity, or become painful, you should see your health care provider. What are the signs of labor?  Abdominal cramps.  Regular contractions that start at 10 minutes apart and become stronger and more frequent with time.  Contractions that start on the top of the uterus and spread down to the lower abdomen and back.  Increased pelvic pressure and dull back pain.  A watery or bloody mucus discharge that comes from the vagina.  Leaking of amniotic fluid. This is also known as your "water breaking." It could be a slow trickle or a gush. Let your health care provider know if it has a color or strange odor. If you have any of these signs, call your health care provider right away, even if it is before your due date. Follow these instructions at home: Medicines  Follow your health care provider's instructions regarding medicine use. Specific medicines may be either safe or unsafe to take during pregnancy.  Take a prenatal vitamin that contains at least 600 micrograms (mcg) of folic acid.  If you develop constipation, try taking a stool softener if your health care provider approves. Eating and drinking  Eat a balanced diet that includes fresh fruits and vegetables, whole grains, good sources of protein  such as meat, eggs, or tofu, and low-fat dairy. Your health care provider will help you determine the amount of weight gain that is right for you.  Avoid raw meat and uncooked cheese. These carry germs that can cause birth defects in the baby.  If you have low calcium intake from food, talk to your health care provider about whether you should take a daily calcium supplement.  Eat four or five small meals rather than three large meals a day.  Limit foods that are high in fat and processed sugars, such as fried and sweet foods.  To prevent constipation: ? Drink enough fluid to keep your urine clear or pale yellow. ? Eat foods that are high in fiber, such as fresh fruits and vegetables, whole grains, and beans. Activity  Exercise only as directed by your health care provider. Most women can continue their usual exercise routine during pregnancy. Try to exercise for 30 minutes at least 5 days a week. Stop exercising if you experience uterine contractions.  Avoid heavy   lifting.  Do not exercise in extreme heat or humidity, or at high altitudes.  Wear low-heel, comfortable shoes.  Practice good posture.  You may continue to have sex unless your health care provider tells you otherwise. Relieving pain and discomfort  Take frequent breaks and rest with your legs elevated if you have leg cramps or low back pain.  Take warm sitz baths to soothe any pain or discomfort caused by hemorrhoids. Use hemorrhoid cream if your health care provider approves.  Wear a good support bra to prevent discomfort from breast tenderness.  If you develop varicose veins: ? Wear support pantyhose or compression stockings as told by your healthcare provider. ? Elevate your feet for 15 minutes, 3-4 times a day. Prenatal care  Write down your questions. Take them to your prenatal visits.  Keep all your prenatal visits as told by your health care provider. This is important. Safety  Wear your seat belt at  all times when driving.  Make a list of emergency phone numbers, including numbers for family, friends, the hospital, and police and fire departments. General instructions  Avoid cat litter boxes and soil used by cats. These carry germs that can cause birth defects in the baby. If you have a cat, ask someone to clean the litter box for you.  Do not travel far distances unless it is absolutely necessary and only with the approval of your health care provider.  Do not use hot tubs, steam rooms, or saunas.  Do not drink alcohol.  Do not use any products that contain nicotine or tobacco, such as cigarettes and e-cigarettes. If you need help quitting, ask your health care provider.  Do not use any medicinal herbs or unprescribed drugs. These chemicals affect the formation and growth of the baby.  Do not douche or use tampons or scented sanitary pads.  Do not cross your legs for long periods of time.  To prepare for the arrival of your baby: ? Take prenatal classes to understand, practice, and ask questions about labor and delivery. ? Make a trial run to the hospital. ? Visit the hospital and tour the maternity area. ? Arrange for maternity or paternity leave through employers. ? Arrange for family and friends to take care of pets while you are in the hospital. ? Purchase a rear-facing car seat and make sure you know how to install it in your car. ? Pack your hospital bag. ? Prepare the baby's nursery. Make sure to remove all pillows and stuffed animals from the baby's crib to prevent suffocation.  Visit your dentist if you have not gone during your pregnancy. Use a soft toothbrush to brush your teeth and be gentle when you floss. Contact a health care provider if:  You are unsure if you are in labor or if your water has broken.  You become dizzy.  You have mild pelvic cramps, pelvic pressure, or nagging pain in your abdominal area.  You have lower back pain.  You have persistent  nausea, vomiting, or diarrhea.  You have an unusual or bad smelling vaginal discharge.  You have pain when you urinate. Get help right away if:  Your water breaks before 37 weeks.  You have regular contractions less than 5 minutes apart before 37 weeks.  You have a fever.  You are leaking fluid from your vagina.  You have spotting or bleeding from your vagina.  You have severe abdominal pain or cramping.  You have rapid weight loss or weight gain.    You have shortness of breath with chest pain.  You notice sudden or extreme swelling of your face, hands, ankles, feet, or legs.  Your baby makes fewer than 10 movements in 2 hours.  You have severe headaches that do not go away when you take medicine.  You have vision changes. Summary  The third trimester is from week 28 through week 40, months 7 through 9. The third trimester is a time when the unborn baby (fetus) is growing rapidly.  During the third trimester, your discomfort may increase as you and your baby continue to gain weight. You may have abdominal, leg, and back pain, sleeping problems, and an increased need to urinate.  During the third trimester your breasts will keep growing and they will continue to become tender. A yellow fluid (colostrum) may leak from your breasts. This is the first milk you are producing for your baby.  False labor is a condition in which you feel small, irregular tightenings of the muscles in the womb (contractions) that eventually go away. These are called Braxton Hicks contractions. Contractions may last for hours, days, or even weeks before true labor sets in.  Signs of labor can include: abdominal cramps; regular contractions that start at 10 minutes apart and become stronger and more frequent with time; watery or bloody mucus discharge that comes from the vagina; increased pelvic pressure and dull back pain; and leaking of amniotic fluid. This information is not intended to replace advice  given to you by your health care provider. Make sure you discuss any questions you have with your health care provider. Document Released: 08/20/2001 Document Revised: 02/01/2016 Document Reviewed: 10/27/2012 Elsevier Interactive Patient Education  2017 [employer]  

## 2017-01-15 NOTE — Progress Notes (Signed)
   PRENATAL VISIT NOTE  Subjective:  Bridget Leach is a 26 y.o. [phone] at 134w1d being seen today for ongoing prenatal care.  She is currently monitored for the following issues for this low-risk pregnancy and has Sickle cell trait carrier; Hx of preeclampsia, prior pregnancy, currently pregnant; Genital herpes; Mild tetrahydrocannabinol (THC) abuse; Anemia; Herpes simplex type 1 infection; GBS bacteriuria; Supervision of other normal pregnancy, antepartum; Late prenatal care; Trichomonal vaginitis during pregnancy in second trimester; Low vitamin D level; Anemia of pregnancy; and Iron deficiency anemia on her problem list.  Patient reports had nausea taking her vitamin.  Contractions: Not present. Vag. Bleeding: None.  Movement: Present. Denies leaking of fluid.   The following portions of the patient's history were reviewed and updated as appropriate: allergies, current medications, past family history, past medical history, past social history, past surgical history and problem list. Problem list updated.  Objective:   Vitals:   01/15/17 0828  BP: 112/72  Pulse: 88  Weight: 80.6 kg (177 lb 12.8 oz)    Fetal Status: Fetal Heart Rate (bpm): 145   Movement: Present     General:  Alert, oriented and cooperative. Patient is in no acute distress.  Skin: Skin is warm and dry. No rash noted.   Cardiovascular: Normal heart rate noted  Respiratory: Normal respiratory effort, no problems with respiration noted  Abdomen: Soft, gravid, appropriate for gestational age. Pain/Pressure: Present     Pelvic:  Cervical exam performed        Extremities: Normal range of motion.  Edema: Trace  Mental Status: Normal mood and affect. Normal behavior. Normal judgment and thought content.   Assessment and Plan:  Pregnancy: [id number] at 434w1d  1. Supervision of other normal pregnancy, antepartum  - Strep Gp B NAA - Cervicovaginal ancillary only - flintstones complete (FLINTSTONES) 60 MG chewable tablet;  Chew 2 tablets by mouth daily.  Dispense: 60 tablet; Refill: 1  2. Anemia during pregnancy in third trimester Considering Feraheme - CBC - ferrous sulfate 325 (65 FE) MG tablet; Take 1 tablet (325 mg total) by mouth daily with breakfast.  Dispense: 30 tablet; Refill: 3  3. Hx of preeclampsia, prior pregnancy, currently pregnant Nl BP  Preterm labor symptoms and general obstetric precautions including but not limited to vaginal bleeding, contractions, leaking of fluid and fetal movement were reviewed in detail with the patient. Please refer to After Visit Summary for other counseling recommendations.  1 week  Bridget Leach, James G, MD

## 2017-01-16 LAB — CERVICOVAGINAL ANCILLARY ONLY
CHLAMYDIA, DNA PROBE: NEGATIVE
Neisseria Gonorrhea: NEGATIVE

## 2017-01-16 LAB — CBC
HEMOGLOBIN: 9.8 g/dL — AB (ref 11.1–15.9)
Hematocrit: 30.4 % — ABNORMAL LOW (ref 34.0–46.6)
MCH: 23.6 pg — AB (ref 26.6–33.0)
MCHC: 32.2 g/dL (ref 31.5–35.7)
MCV: 73 fL — ABNORMAL LOW (ref 79–97)
PLATELETS: 140 10*3/uL — AB (ref 150–379)
RBC: 4.15 x10E6/uL (ref 3.77–5.28)
RDW: 17 % — ABNORMAL HIGH (ref 12.3–15.4)
WBC: 8.3 10*3/uL (ref 3.4–10.8)

## 2017-01-16 LAB — OB RESULTS CONSOLE GBS: STREP GROUP B AG: POSITIVE

## 2017-01-17 LAB — STREP GP B NAA: STREP GROUP B AG: POSITIVE — AB

## 2017-01-21 ENCOUNTER — Telehealth: Payer: Self-pay | Admitting: *Deleted

## 2017-01-21 NOTE — Telephone Encounter (Signed)
Pt called to office for lab results. Results were discussed and sent to MyChart for pt viewing.

## 2017-01-22 ENCOUNTER — Ambulatory Visit (INDEPENDENT_AMBULATORY_CARE_PROVIDER_SITE_OTHER): Payer: Medicaid Other | Admitting: Obstetrics and Gynecology

## 2017-01-22 VITALS — BP 125/81 | HR 92 | Wt 180.6 lb

## 2017-01-22 DIAGNOSIS — B009 Herpesviral infection, unspecified: Secondary | ICD-10-CM

## 2017-01-22 DIAGNOSIS — Z3483 Encounter for supervision of other normal pregnancy, third trimester: Secondary | ICD-10-CM

## 2017-01-22 DIAGNOSIS — Z3480 Encounter for supervision of other normal pregnancy, unspecified trimester: Secondary | ICD-10-CM

## 2017-01-22 DIAGNOSIS — O99013 Anemia complicating pregnancy, third trimester: Secondary | ICD-10-CM

## 2017-01-22 DIAGNOSIS — R8271 Bacteriuria: Secondary | ICD-10-CM

## 2017-01-22 MED ORDER — VALACYCLOVIR HCL 500 MG PO TABS: 500.0000 mg | ORAL_TABLET | Freq: Two times a day (BID) | ORAL | 1 refills | Status: DC

## 2017-01-22 NOTE — Progress Notes (Signed)
Subjective:  Duane BostonShacarrah Delavega is a 26 y.o. [phone] at 2742w1d being seen today for ongoing prenatal care.  She is currently monitored for the following issues for this high-risk pregnancy and has Sickle cell trait carrier; Hx of preeclampsia, prior pregnancy, currently pregnant; Genital herpes; Mild tetrahydrocannabinol (THC) abuse; Anemia; Herpes simplex type 1 infection; GBS bacteriuria; Supervision of other normal pregnancy, antepartum; Late prenatal care; Low vitamin D level; Anemia of pregnancy; and Iron deficiency anemia on her problem list.  Patient reports some LE swelling and left hand numbness.  Contractions: Not present. Vag. Bleeding: None.  Movement: Present. Denies leaking of fluid.   The following portions of the patient's history were reviewed and updated as appropriate: allergies, current medications, past family history, past medical history, past social history, past surgical history and problem list. Problem list updated.  Objective:   Vitals:   01/22/17 1311  BP: 125/81  Pulse: 92  Weight: 180 lb 9.6 oz (81.9 kg)    Fetal Status: Fetal Heart Rate (bpm): 130   Movement: Present     General:  Alert, oriented and cooperative. Patient is in no acute distress.  Skin: Skin is warm and dry. No rash noted.   Cardiovascular: Normal heart rate noted  Respiratory: Normal respiratory effort, no problems with respiration noted  Abdomen: Soft, gravid, appropriate for gestational age. Pain/Pressure: Absent     Pelvic:  Cervical exam deferred        Extremities: Normal range of motion.  Edema: None  Mental Status: Normal mood and affect. Normal behavior. Normal judgment and thought content.   Urinalysis:      Assessment and Plan:  Pregnancy: [id number] at 3742w1d  1. Supervision of other normal pregnancy, antepartum Stable Labor precautions Valtrex for HSV prophylaxis  Suspect hand numbness is related to Carpel tunnel, wrist splint recommended Pt not taking BASA  2. GBS  bacteriuria Tx while in labor  3. Anemia during pregnancy in third trimester Continue with iron and PNV  Term labor symptoms and general obstetric precautions including but not limited to vaginal bleeding, contractions, leaking of fluid and fetal movement were reviewed in detail with the patient. Please refer to After Visit Summary for other counseling recommendations.  Return in about 1 week (around 01/29/2017) for OB visit.   Hermina StaggersErvin, Omarri Eich L, MD

## 2017-01-22 NOTE — Progress Notes (Signed)
Patient is still working and she is experiencing feet swelling. Patient is also having L hand numbness. Patient is working on an Theatre stage managerassembly line.

## 2017-01-22 NOTE — Addendum Note (Signed)
Addended by: Hermina StaggersERVIN, Tymarion Everard L on: 01/22/2017 02:01 PM   Modules accepted: Orders

## 2017-01-22 NOTE — Patient Instructions (Signed)

## 2017-01-29 ENCOUNTER — Ambulatory Visit (INDEPENDENT_AMBULATORY_CARE_PROVIDER_SITE_OTHER): Payer: Medicaid Other | Admitting: Obstetrics and Gynecology

## 2017-01-29 VITALS — BP 124/84 | HR 84 | Wt 178.2 lb

## 2017-01-29 DIAGNOSIS — Z3480 Encounter for supervision of other normal pregnancy, unspecified trimester: Secondary | ICD-10-CM

## 2017-01-29 DIAGNOSIS — Z3483 Encounter for supervision of other normal pregnancy, third trimester: Secondary | ICD-10-CM

## 2017-01-29 DIAGNOSIS — B009 Herpesviral infection, unspecified: Secondary | ICD-10-CM

## 2017-01-29 DIAGNOSIS — O99013 Anemia complicating pregnancy, third trimester: Secondary | ICD-10-CM

## 2017-01-29 DIAGNOSIS — R8271 Bacteriuria: Secondary | ICD-10-CM

## 2017-01-29 NOTE — Progress Notes (Signed)
ROB pt c/o intermittent L leg numbness. Pt is standing on feet 8 hrs daily, 40 hrs weekly.

## 2017-01-29 NOTE — Progress Notes (Signed)
Subjective:  Bridget Leach is a 26 y.o. [phone] at 6655w1d being seen today for ongoing prenatal care.  She is currently monitored for the following issues for this high-risk pregnancy and has Sickle cell trait carrier; Hx of preeclampsia, prior pregnancy, currently pregnant; Genital herpes; Mild tetrahydrocannabinol (THC) abuse; Anemia; Herpes simplex type 1 infection; GBS bacteriuria; Supervision of other normal pregnancy, antepartum; Late prenatal care; Low vitamin D level; Anemia of pregnancy; and Iron deficiency anemia on her problem list.  Patient reports no complaints.  Contractions: Not present. Vag. Bleeding: None.  Movement: Present. Denies leaking of fluid.   The following portions of the patient's history were reviewed and updated as appropriate: allergies, current medications, past family history, past medical history, past social history, past surgical history and problem list. Problem list updated.  Objective:   Vitals:   01/29/17 1016  BP: 124/84  Pulse: 84  Weight: 178 lb 3.2 oz (80.8 kg)    Fetal Status: Fetal Heart Rate (bpm): 130   Movement: Present     General:  Alert, oriented and cooperative. Patient is in no acute distress.  Skin: Skin is warm and dry. No rash noted.   Cardiovascular: Normal heart rate noted  Respiratory: Normal respiratory effort, no problems with respiration noted  Abdomen: Soft, gravid, appropriate for gestational age. Pain/Pressure: Present     Pelvic:  Cervical exam performed        Extremities: Normal range of motion.  Edema: None  Mental Status: Normal mood and affect. Normal behavior. Normal judgment and thought content.   Urinalysis:      Assessment and Plan:  Pregnancy: [id number] at 6155w1d  1. Supervision of other normal pregnancy, antepartum Labor precautions  2. GBS bacteriuria Tx while in labor  3. Anemia during pregnancy in third trimester Continue with PNV/Fe  4. Herpes simplex type 1 infection Continue with  Valtrex  Term labor symptoms and general obstetric precautions including but not limited to vaginal bleeding, contractions, leaking of fluid and fetal movement were reviewed in detail with the patient. Please refer to After Visit Summary for other counseling recommendations.  Return in about 1 week (around 02/05/2017) for OB visit.   Hermina StaggersErvin, Yojan Paskett L, MD

## 2017-01-29 NOTE — Patient Instructions (Signed)

## 2017-02-05 ENCOUNTER — Ambulatory Visit (INDEPENDENT_AMBULATORY_CARE_PROVIDER_SITE_OTHER): Payer: Medicaid Other | Admitting: Obstetrics & Gynecology

## 2017-02-05 VITALS — BP 122/88 | HR 80 | Wt 182.0 lb

## 2017-02-05 DIAGNOSIS — B009 Herpesviral infection, unspecified: Secondary | ICD-10-CM

## 2017-02-05 DIAGNOSIS — Z3480 Encounter for supervision of other normal pregnancy, unspecified trimester: Secondary | ICD-10-CM

## 2017-02-05 DIAGNOSIS — O09293 Supervision of pregnancy with other poor reproductive or obstetric history, third trimester: Secondary | ICD-10-CM

## 2017-02-05 DIAGNOSIS — O09893 Supervision of other high risk pregnancies, third trimester: Secondary | ICD-10-CM

## 2017-02-05 DIAGNOSIS — O09299 Supervision of pregnancy with other poor reproductive or obstetric history, unspecified trimester: Secondary | ICD-10-CM

## 2017-02-05 IMAGING — US US MFM OB TRANSVAGINAL
1 series · 15 of 28 positions shown · non-contrast
Comparison: none

[Series 1: us mfm ob transvaginal · 15 of 36 slices shown]
[im 1/36]
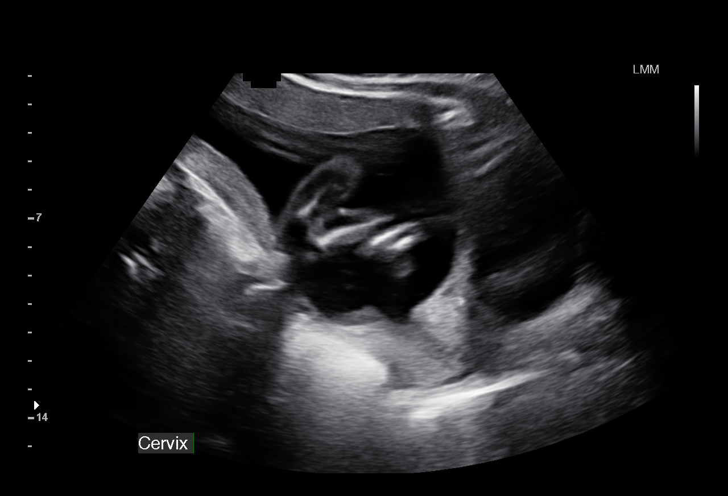
[im 3/36]
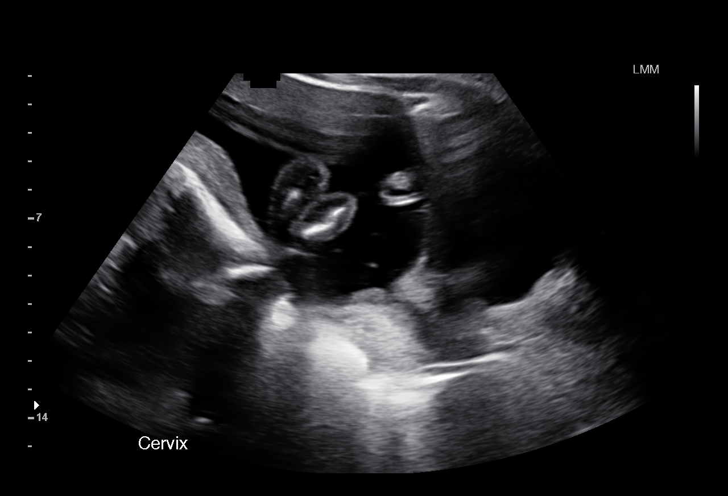
[im 6/36]
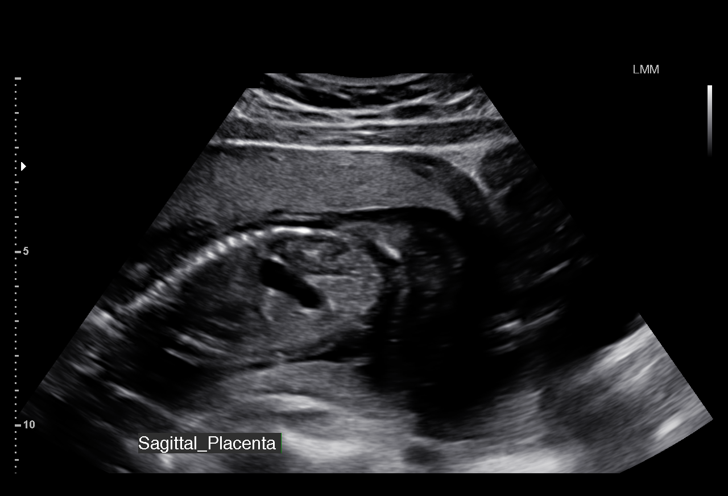
[im 8/36]
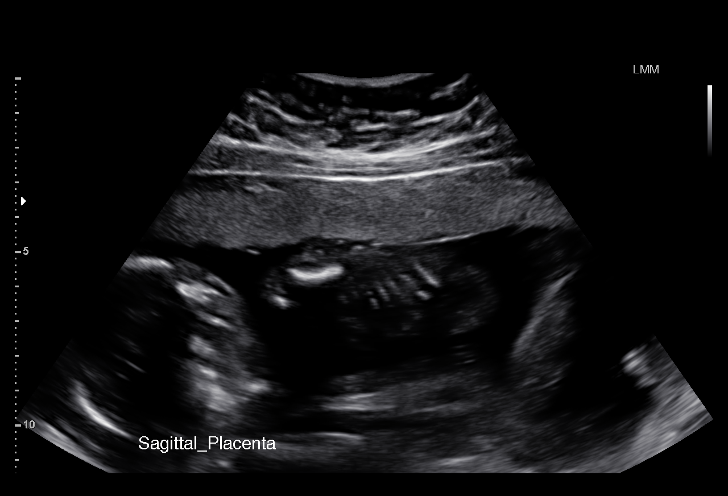
[im 11/36]
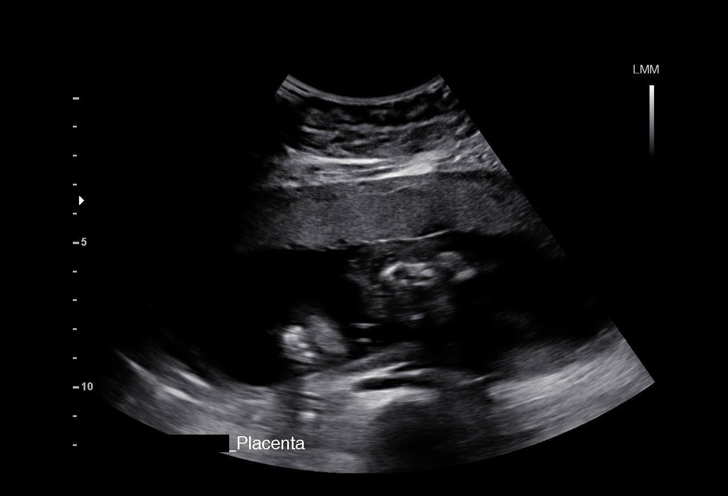
[im 13/36]
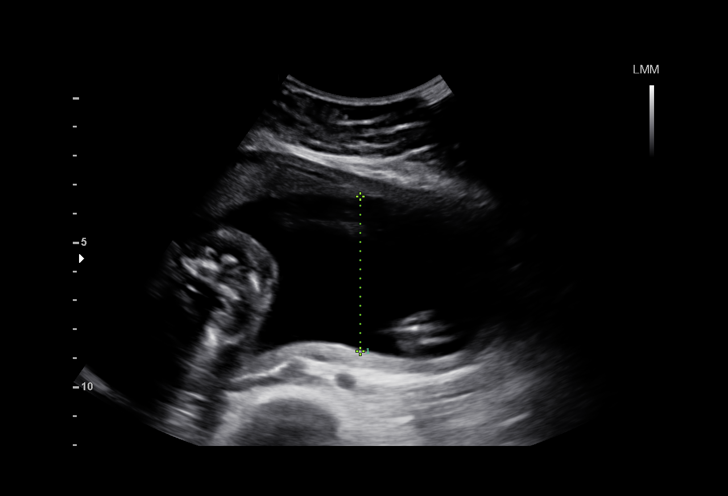
[im 16/36]
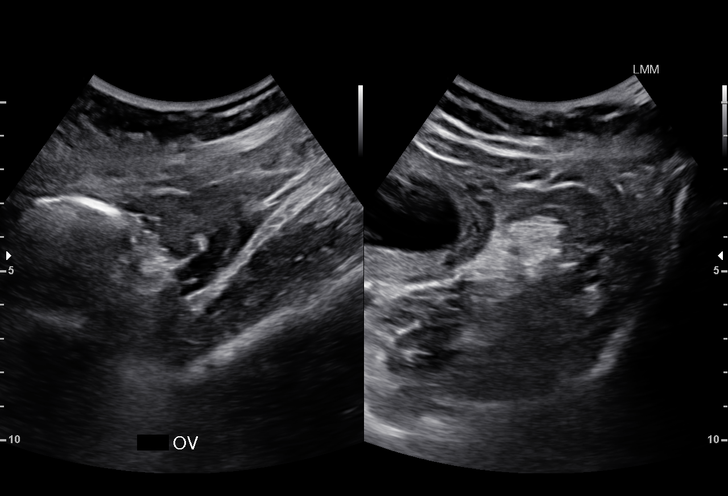
[im 19/36]
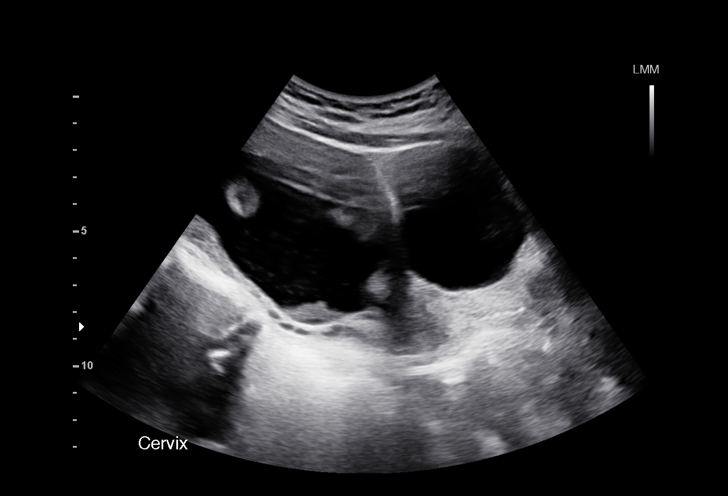
[im 20/36]
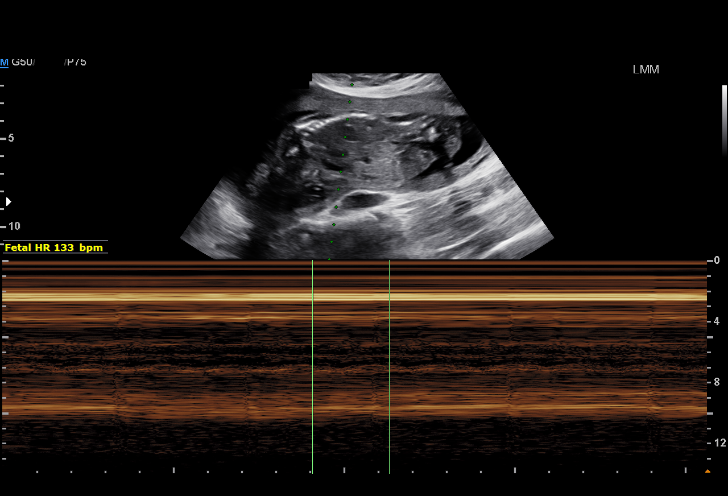
[im 23/36]
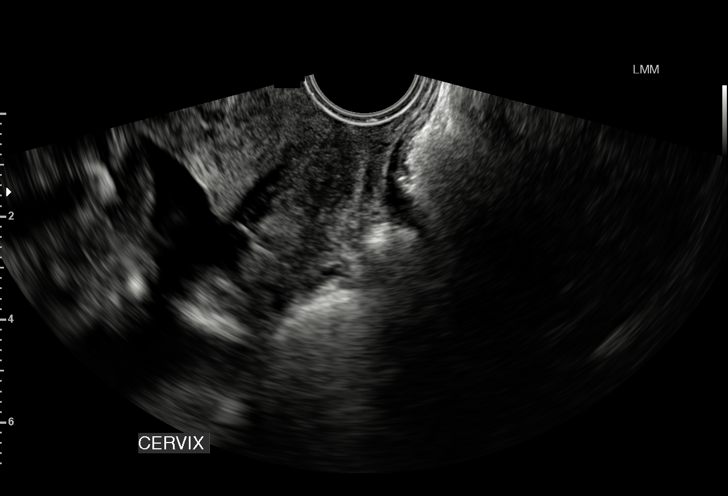
[im 25/36]
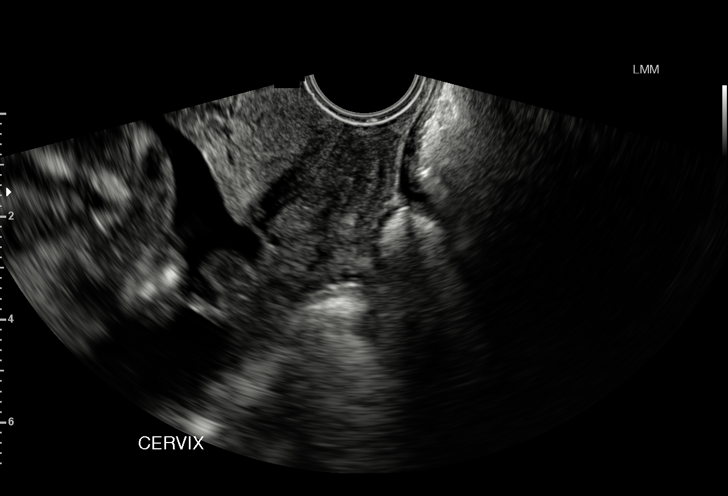
[im 28/36]
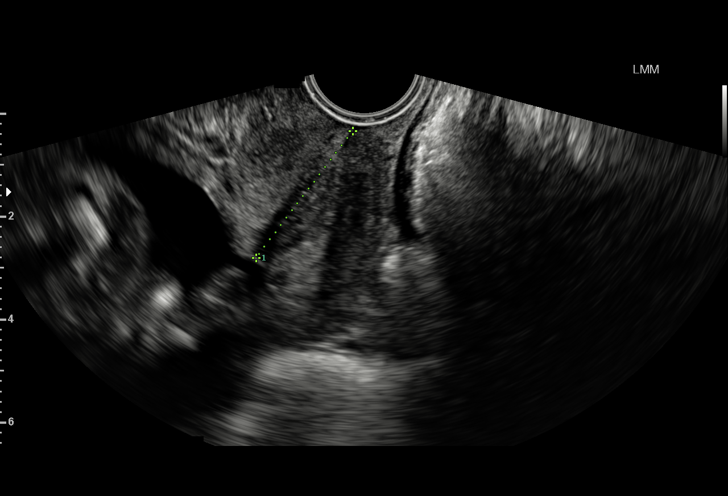
[im 30/36]
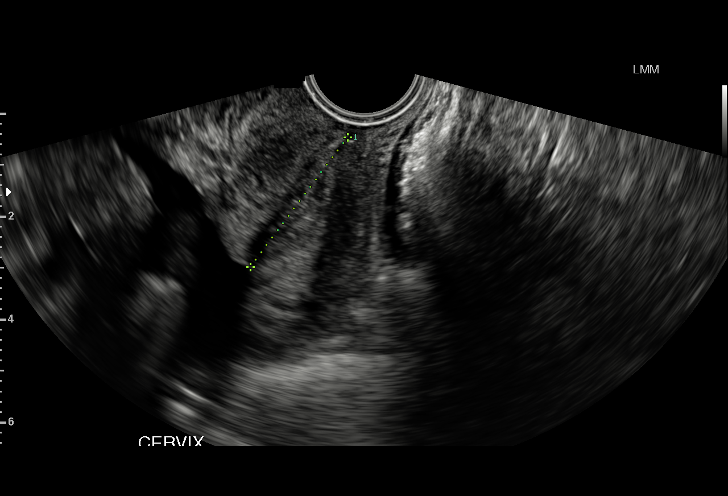
[im 33/36]
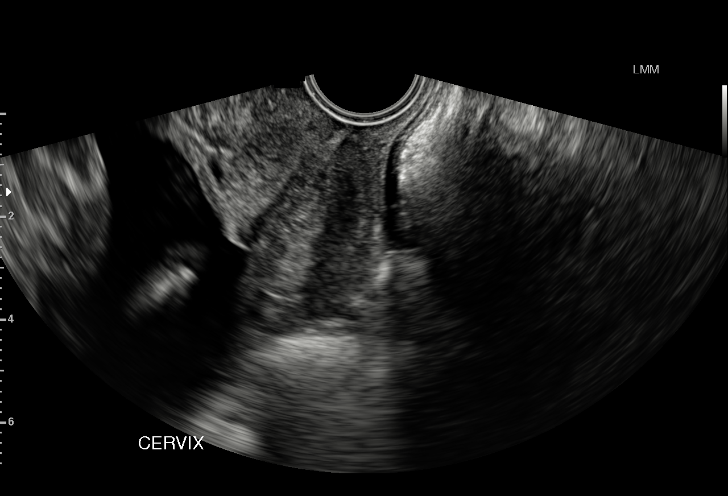
[im 36/36]
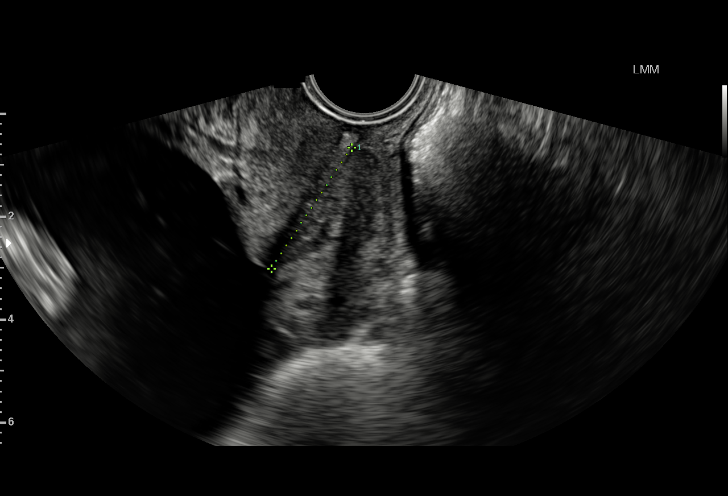

[15 of 28 positions shown; findings below may reference images not displayed]

OBSTETRICS REPORT

Service(s) Provided

US MFM OB LIMITED                                     76815.01
US MFM OB TRANSVAGINAL                                76817.2
Indications

Abdominal pain in pregnancy (cramping x "couple
weeks")
20 weeks gestation of pregnancy
Fetal Evaluation

Num Of Fetuses:    1
Fetal Heart Rate:  133                          bpm
Cardiac Activity:  Observed
Presentation:      Breech
Placenta:          Anterior, above cervical os
P. Cord            Visualized, central
Insertion:

Amniotic Fluid
AFI FV:      Subjectively within normal limits
Larg Pckt:     5.3  cm
Gestational Age

LMP:           20w 3d        Date:  02/01/15                 EDD:   11/08/15
Best:          20w 3d     Det. By:  LMP  (02/01/15)          EDD:   11/08/15
Cervix Uterus Adnexa

Cervical Length:    2.8      cm

Cervix:       Normal appearance by transvaginal scan
Uterus:       No abnormality visualized.
Cul De Sac:   No free fluid seen.

Left Ovary:    Within normal limits.
Right Ovary:   Within normal limits.
Adnexa:     No abnormality visualized.
Impression

SIUP at 39w1d (remote read only)
active fetus in breech presentation
no previa
amniotic fluid is gestational age appropriate
cervix is long and closed measuring 2.8cm
Recommendations

Follow up ultrasounds as clinically indicated.

Attending Physician, KOUDOUGOU

## 2017-02-05 NOTE — Progress Notes (Signed)
   PRENATAL VISIT NOTE  Subjective:  Bridget Leach is a 26 y.o. [phone] at 4968w1d being seen today for ongoing prenatal care.  She is currently monitored for the following issues for this high-risk pregnancy and has Sickle cell trait carrier; Hx of preeclampsia, prior pregnancy, currently pregnant; Genital herpes; Mild tetrahydrocannabinol (THC) abuse; Anemia; Herpes simplex type 1 infection; GBS bacteriuria; Supervision of other normal pregnancy, antepartum; Late prenatal care; Low vitamin D level; Anemia of pregnancy; and Iron deficiency anemia on her problem list.  Patient reports no complaints.  Contractions: Irregular. Vag. Bleeding: None.  Movement: Present. Denies leaking of fluid.   The following portions of the patient's history were reviewed and updated as appropriate: allergies, current medications, past family history, past medical history, past social history, past surgical history and problem list. Problem list updated.  Objective:   Vitals:   02/05/17 1027  BP: 122/88  Pulse: 80  Weight: 182 lb (82.6 kg)    Fetal Status: Fetal Heart Rate (bpm): 139 Fundal Height: 36 cm Movement: Present     General:  Alert, oriented and cooperative. Patient is in no acute distress.  Skin: Skin is warm and dry. No rash noted.   Cardiovascular: Normal heart rate noted  Respiratory: Normal respiratory effort, no problems with respiration noted  Abdomen: Soft, gravid, appropriate for gestational age. Pain/Pressure: Present     Pelvic:  Cervical exam deferred        Extremities: Normal range of motion.  Edema: Trace  Mental Status: Normal mood and affect. Normal behavior. Normal judgment and thought content.   Assessment and Plan:  Pregnancy: [id number] at 26  1. Supervision of other normal pregnancy, antepartum Nl BP  2. Hx of preeclampsia, prior pregnancy, currently pregnant Nl BP  3. Herpes simplex type 1 infection Valtrex suppression therapy  Term labor symptoms and general  obstetric precautions including but not limited to vaginal bleeding, contractions, leaking of fluid and fetal movement were reviewed in detail with the patient. Please refer to After Visit Summary for other counseling recommendations.  Return in about 1 week (around 02/12/2017).   Scheryl DarterJames Ercel Normoyle, MD

## 2017-02-05 NOTE — Patient Instructions (Signed)

## 2017-02-12 ENCOUNTER — Ambulatory Visit (INDEPENDENT_AMBULATORY_CARE_PROVIDER_SITE_OTHER): Payer: Medicaid Other | Admitting: Obstetrics and Gynecology

## 2017-02-12 VITALS — BP 123/91 | HR 96 | Wt 183.7 lb

## 2017-02-12 DIAGNOSIS — B009 Herpesviral infection, unspecified: Secondary | ICD-10-CM

## 2017-02-12 DIAGNOSIS — R8271 Bacteriuria: Secondary | ICD-10-CM

## 2017-02-12 DIAGNOSIS — Z3483 Encounter for supervision of other normal pregnancy, third trimester: Secondary | ICD-10-CM

## 2017-02-12 DIAGNOSIS — Z3480 Encounter for supervision of other normal pregnancy, unspecified trimester: Secondary | ICD-10-CM

## 2017-02-12 NOTE — Progress Notes (Signed)
Patient reports good fetal movement and contractions that come and go with occasional pressure.

## 2017-02-12 NOTE — Patient Instructions (Signed)
Vaginal Delivery Vaginal delivery means that you will give birth by pushing your baby out of your birth canal (vagina). A team of health care providers will help you before, during, and after vaginal delivery. Birth experiences are unique for every woman and every pregnancy, and birth experiences vary depending on where you choose to give birth. What should I do to prepare for my baby's birth? Before your baby is born, it is important to talk with your health care provider about:  Your labor and delivery preferences. These may include: ? Medicines that you may be given. ? How you will manage your pain. This might include non-medical pain relief techniques or injectable pain relief such as epidural analgesia. ? How you and your baby will be monitored during labor and delivery. ? Who may be in the labor and delivery room with you. ? Your feelings about surgical delivery of your baby (cesarean delivery, or C-section) if this becomes necessary. ? Your feelings about receiving donated blood through an IV tube (blood transfusion) if this becomes necessary.  Whether you are able: ? To take pictures or videos of the birth. ? To eat during labor and delivery. ? To move around, walk, or change positions during labor and delivery.  What to expect after your baby is born, such as: ? Whether delayed umbilical cord clamping and cutting is offered. ? Who will care for your baby right after birth. ? Medicines or tests that may be recommended for your baby. ? Whether breastfeeding is supported in your hospital or birth center. ? How long you will be in the hospital or birth center.  How any medical conditions you have may affect your baby or your labor and delivery experience.  To prepare for your baby's birth, you should also:  Attend all of your health care visits before delivery (prenatal visits) as recommended by your health care provider. This is important.  Prepare your home for your baby's  arrival. Make sure that you have: ? Diapers. ? Baby clothing. ? Feeding equipment. ? Safe sleeping arrangements for you and your baby.  Install a car seat in your vehicle. Have your car seat checked by a certified car seat installer to make sure that it is installed safely.  Think about who will help you with your new baby at home for at least the first several weeks after delivery.  What can I expect when I arrive at the birth center or hospital? Once you are in labor and have been admitted into the hospital or birth center, your health care provider may:  Review your pregnancy history and any concerns you have.  Insert an IV tube into one of your veins. This is used to give you fluids and medicines.  Check your blood pressure, pulse, temperature, and heart rate (vital signs).  Check whether your bag of water (amniotic sac) has broken (ruptured).  Talk with you about your birth plan and discuss pain control options.  Monitoring Your health care provider may monitor your contractions (uterine monitoring) and your baby's heart rate (fetal monitoring). You may need to be monitored:  Often, but not continuously (intermittently).  All the time or for long periods at a time (continuously). Continuous monitoring may be needed if: ? You are taking certain medicines, such as medicine to relieve pain or make your contractions stronger. ? You have pregnancy or labor complications.  Monitoring may be done by:  Placing a special stethoscope or a handheld monitoring device on your abdomen to   check your baby's heartbeat, and feeling your abdomen for contractions. This method of monitoring does not continuously record your baby's heartbeat or your contractions.  Placing monitors on your abdomen (external monitors) to record your baby's heartbeat and the frequency and length of contractions. You may not have to wear external monitors all the time.  Placing monitors inside of your uterus  (internal monitors) to record your baby's heartbeat and the frequency, length, and strength of your contractions. ? Your health care provider may use internal monitors if he or she needs more information about the strength of your contractions or your baby's heart rate. ? Internal monitors are put in place by passing a thin, flexible wire through your vagina and into your uterus. Depending on the type of monitor, it may remain in your uterus or on your baby's head until birth. ? Your health care provider will discuss the benefits and risks of internal monitoring with you and will ask for your permission before inserting the monitors.  Telemetry. This is a type of continuous monitoring that can be done with external or internal monitors. Instead of having to stay in bed, you are able to move around during telemetry. Ask your health care provider if telemetry is an option for you.  Physical exam Your health care provider may perform a physical exam. This may include:  Checking whether your baby is positioned: ? With the head toward your vagina (head-down). This is most common. ? With the head toward the top of your uterus (head-up or breech). If your baby is in a breech position, your health care provider may try to turn your baby to a head-down position so you can deliver vaginally. If it does not seem that your baby can be born vaginally, your provider may recommend surgery to deliver your baby. In rare cases, you may be able to deliver vaginally if your baby is head-up (breech delivery). ? Lying sideways (transverse). Babies that are lying sideways cannot be delivered vaginally.  Checking your cervix to determine: ? Whether it is thinning out (effacing). ? Whether it is opening up (dilating). ? How low your baby has moved into your birth canal.  What are the three stages of labor and delivery?  Normal labor and delivery is divided into the following three stages: Stage 1  Stage 1 is the  longest stage of labor, and it can last for hours or days. Stage 1 includes: ? Early labor. This is when contractions may be irregular, or regular and mild. Generally, early labor contractions are more than 10 minutes apart. ? Active labor. This is when contractions get longer, more regular, more frequent, and more intense. ? The transition phase. This is when contractions happen very close together, are very intense, and may last longer than during any other part of labor.  Contractions generally feel mild, infrequent, and irregular at first. They get stronger, more frequent (about every 2-3 minutes), and more regular as you progress from early labor through active labor and transition.  Many women progress through stage 1 naturally, but you may need help to continue making progress. If this happens, your health care provider may talk with you about: ? Rupturing your amniotic sac if it has not ruptured yet. ? Giving you medicine to help make your contractions stronger and more frequent.  Stage 1 ends when your cervix is completely dilated to 4 inches (10 cm) and completely effaced. This happens at the end of the transition phase. Stage 2  Once   your cervix is completely effaced and dilated to 4 inches (10 cm), you may start to feel an urge to push. It is common for the body to naturally take a rest before feeling the urge to push, especially if you received an epidural or certain other pain medicines. This rest period may last for up to 1-2 hours, depending on your unique labor experience.  During stage 2, contractions are generally less painful, because pushing helps relieve contraction pain. Instead of contraction pain, you may feel stretching and burning pain, especially when the widest part of your baby's head passes through the vaginal opening (crowning).  Your health care provider will closely monitor your pushing progress and your baby's progress through the vagina during stage 2.  Your  health care provider may massage the area of skin between your vaginal opening and anus (perineum) or apply warm compresses to your perineum. This helps it stretch as the baby's head starts to crown, which can help prevent perineal tearing. ? In some cases, an incision may be made in your perineum (episiotomy) to allow the baby to pass through the vaginal opening. An episiotomy helps to make the opening of the vagina larger to allow more room for the baby to fit through.  It is very important to breathe and focus so your health care provider can control the delivery of your baby's head. Your health care provider may have you decrease the intensity of your pushing, to help prevent perineal tearing.  After delivery of your baby's head, the shoulders and the rest of the body generally deliver very quickly and without difficulty.  Once your baby is delivered, the umbilical cord may be cut right away, or this may be delayed for 1-2 minutes, depending on your baby's health. This may vary among health care providers, hospitals, and birth centers.  If you and your baby are healthy enough, your baby may be placed on your chest or abdomen to help maintain the baby's temperature and to help you bond with each other. Some mothers and babies start breastfeeding at this time. Your health care team will dry your baby and help keep your baby warm during this time.  Your baby may need immediate care if he or she: ? Showed signs of distress during labor. ? Has a medical condition. ? Was born too early (prematurely). ? Had a bowel movement before birth (meconium). ? Shows signs of difficulty transitioning from being inside the uterus to being outside of the uterus. If you are planning to breastfeed, your health care team will help you begin a feeding. Stage 3  The third stage of labor starts immediately after the birth of your baby and ends after you deliver the placenta. The placenta is an organ that develops  during pregnancy to provide oxygen and nutrients to your baby in the womb.  Delivering the placenta may require some pushing, and you may have mild contractions. Breastfeeding can stimulate contractions to help you deliver the placenta.  After the placenta is delivered, your uterus should tighten (contract) and become firm. This helps to stop bleeding in your uterus. To help your uterus contract and to control bleeding, your health care provider may: ? Give you medicine by injection, through an IV tube, by mouth, or through your rectum (rectally). ? Massage your abdomen or perform a vaginal exam to remove any blood clots that are left in your uterus. ? Empty your bladder by placing a thin, flexible tube (catheter) into your bladder. ? Encourage   you to breastfeed your baby. After labor is over, you and your baby will be monitored closely to ensure that you are both healthy until you are ready to go home. Your health care team will teach you how to care for yourself and your baby. This information is not intended to replace advice given to you by your health care provider. Make sure you discuss any questions you have with your health care provider. Document Released: 06/04/2008 Document Revised: 03/15/2016 Document Reviewed: 09/10/2015 Elsevier Interactive Patient Education  2018 [employer]  

## 2017-02-12 NOTE — Progress Notes (Signed)
Subjective:  Bridget Leach is a 26 y.o. [phone] at 6580w1d being seen today for ongoing prenatal care.  She is currently monitored for the following issues for this low-risk pregnancy and has Sickle cell trait carrier; Hx of preeclampsia, prior pregnancy, currently pregnant; Genital herpes; Mild tetrahydrocannabinol (THC) abuse; Anemia; Herpes simplex type 1 infection; GBS bacteriuria; Supervision of other normal pregnancy, antepartum; Late prenatal care; Low vitamin D level; Anemia of pregnancy; and Iron deficiency anemia on her problem list.  Patient reports no complaints.  Contractions: Irregular. Vag. Bleeding: None.  Movement: Present. Denies leaking of fluid.   The following portions of the patient's history were reviewed and updated as appropriate: allergies, current medications, past family history, past medical history, past social history, past surgical history and problem list. Problem list updated.  Objective:   Vitals:   02/12/17 1033  BP: (!) 123/91  Pulse: 96  Weight: 183 lb 11.2 oz (83.3 kg)    Fetal Status: Fetal Heart Rate (bpm): 136   Movement: Present     General:  Alert, oriented and cooperative. Patient is in no acute distress.  Skin: Skin is warm and dry. No rash noted.   Cardiovascular: Normal heart rate noted  Respiratory: Normal respiratory effort, no problems with respiration noted  Abdomen: Soft, gravid, appropriate for gestational age. Pain/Pressure: Present     Pelvic:  Cervical exam deferred        Extremities: Normal range of motion.  Edema: Trace  Mental Status: Normal mood and affect. Normal behavior. Normal judgment and thought content.   Urinalysis:      Assessment and Plan:  Pregnancy: [id number] at 1680w1d  1. Supervision of other normal pregnancy, antepartum Stable Labor precautions IOL at 41 weeks  2. GBS bacteriuria Tx while in labor  3. Herpes simplex type 1 infection Having some mouth sores, will increase to 1000mg  Valtrex bid, reeval  next visit  Term labor symptoms and general obstetric precautions including but not limited to vaginal bleeding, contractions, leaking of fluid and fetal movement were reviewed in detail with the patient. Please refer to After Visit Summary for other counseling recommendations.  Return in about 1 week (around 02/19/2017) for OB visit.   Hermina StaggersErvin, Euna Armon L, MD

## 2017-02-13 ENCOUNTER — Encounter (HOSPITAL_COMMUNITY): Payer: Self-pay

## 2017-02-13 ENCOUNTER — Inpatient Hospital Stay (HOSPITAL_COMMUNITY): Payer: Medicaid Other | Admitting: Anesthesiology

## 2017-02-13 ENCOUNTER — Inpatient Hospital Stay (HOSPITAL_COMMUNITY)
Admission: AD | Admit: 2017-02-13 | Discharge: 2017-02-16 | DRG: 774 | Disposition: A | Discharge status: Home / Self Care | Payer: Medicaid Other | Source: Ambulatory Visit | Attending: Family Medicine | Admitting: Family Medicine

## 2017-02-13 ENCOUNTER — Encounter: Payer: Self-pay | Admitting: Obstetrics and Gynecology

## 2017-02-13 DIAGNOSIS — O99824 Streptococcus B carrier state complicating childbirth: Principal | ICD-10-CM | POA: Diagnosis present

## 2017-02-13 DIAGNOSIS — D573 Sickle-cell trait: Secondary | ICD-10-CM | POA: Diagnosis present

## 2017-02-13 DIAGNOSIS — O9852 Other viral diseases complicating childbirth: Secondary | ICD-10-CM | POA: Diagnosis present

## 2017-02-13 DIAGNOSIS — Z3A39 39 weeks gestation of pregnancy: Secondary | ICD-10-CM | POA: Diagnosis not present

## 2017-02-13 DIAGNOSIS — O9902 Anemia complicating childbirth: Secondary | ICD-10-CM | POA: Diagnosis present

## 2017-02-13 DIAGNOSIS — B001 Herpesviral vesicular dermatitis: Secondary | ICD-10-CM | POA: Diagnosis present

## 2017-02-13 DIAGNOSIS — Z3493 Encounter for supervision of normal pregnancy, unspecified, third trimester: Secondary | ICD-10-CM | POA: Diagnosis present

## 2017-02-13 LAB — COMPREHENSIVE METABOLIC PANEL
ALBUMIN: 2.8 g/dL — AB (ref 3.5–5.0)
ALT: 20 U/L (ref 14–54)
AST: 18 U/L (ref 15–41)
Alkaline Phosphatase: 128 U/L — ABNORMAL HIGH (ref 38–126)
Anion gap: 9 (ref 5–15)
BUN: 9 mg/dL (ref 6–20)
CHLORIDE: 106 mmol/L (ref 101–111)
CO2: 22 mmol/L (ref 22–32)
Calcium: 9.2 mg/dL (ref 8.9–10.3)
Creatinine, Ser: 0.7 mg/dL (ref 0.44–1.00)
GFR calc Af Amer: >60 mL/min (ref >60)
GFR calc non Af Amer: >60 mL/min (ref >60)
GLUCOSE: 81 mg/dL (ref 65–99)
POTASSIUM: 3.5 mmol/L (ref 3.5–5.1)
SODIUM: 137 mmol/L (ref 135–145)
Total Bilirubin: 0.6 mg/dL (ref 0.3–1.2)
Total Protein: 6.9 g/dL (ref 6.5–8.1)

## 2017-02-13 LAB — CBC
HEMATOCRIT: 33.6 % — AB (ref 36.0–46.0)
HEMOGLOBIN: 11.3 g/dL — AB (ref 12.0–15.0)
MCH: 24.1 pg — ABNORMAL LOW (ref 26.0–34.0)
MCHC: 33.6 g/dL (ref 30.0–36.0)
MCV: 71.6 fL — ABNORMAL LOW (ref 78.0–100.0)
Platelets: 128 10*3/uL — ABNORMAL LOW (ref 150–400)
RBC: 4.69 MIL/uL (ref 3.87–5.11)
RDW: 18 % — ABNORMAL HIGH (ref 11.5–15.5)
WBC: 8.5 10*3/uL (ref 4.0–10.5)

## 2017-02-13 LAB — TYPE AND SCREEN
ABO/RH(D): O POS
Antibody Screen: NEGATIVE

## 2017-02-13 LAB — PROTEIN / CREATININE RATIO, URINE
Creatinine, Urine: 46 mg/dL
Total Protein, Urine: <6 mg/dL

## 2017-02-13 MED ORDER — PHENYLEPHRINE 40 MCG/ML (10ML) SYRINGE FOR IV PUSH (FOR BLOOD PRESSURE SUPPORT): 80.0000 ug | PREFILLED_SYRINGE | INTRAVENOUS | Status: DC | PRN

## 2017-02-13 MED ORDER — LIDOCAINE HCL (PF) 1 % IJ SOLN
INTRAMUSCULAR | Status: DC | PRN
  Administered 2017-02-13: 5 mL via EPIDURAL

## 2017-02-13 MED ORDER — OXYTOCIN BOLUS FROM INFUSION
500.0000 mL | Freq: Once | INTRAVENOUS | Status: AC
  Administered 2017-02-14: 500 mL via INTRAVENOUS

## 2017-02-13 MED ORDER — LACTATED RINGERS IV SOLN: 500.0000 mL | Freq: Once | INTRAVENOUS | Status: DC

## 2017-02-13 MED ORDER — EPHEDRINE 5 MG/ML INJ
10.0000 mg | INTRAVENOUS | Status: DC | PRN
  Filled 2017-02-13: qty 2

## 2017-02-13 MED ORDER — PENICILLIN G POTASSIUM 5000000 UNITS IJ SOLR
5.0000 10*6.[IU] | Freq: Once | INTRAVENOUS | Status: AC
  Administered 2017-02-13: 5 10*6.[IU] via INTRAVENOUS
  Filled 2017-02-13: qty 5

## 2017-02-13 MED ORDER — SOD CITRATE-CITRIC ACID 500-334 MG/5ML PO SOLN: 30.0000 mL | ORAL | Status: DC | PRN

## 2017-02-13 MED ORDER — LACTATED RINGERS IV SOLN: INTRAVENOUS | Status: DC

## 2017-02-13 MED ORDER — OXYCODONE-ACETAMINOPHEN 5-325 MG PO TABS: 2.0000 | ORAL_TABLET | ORAL | Status: DC | PRN

## 2017-02-13 MED ORDER — PHENYLEPHRINE 40 MCG/ML (10ML) SYRINGE FOR IV PUSH (FOR BLOOD PRESSURE SUPPORT)
80.0000 ug | PREFILLED_SYRINGE | INTRAVENOUS | Status: DC | PRN
  Filled 2017-02-13: qty 5

## 2017-02-13 MED ORDER — EPHEDRINE 5 MG/ML INJ: 10.0000 mg | INTRAVENOUS | Status: DC | PRN

## 2017-02-13 MED ORDER — DIPHENHYDRAMINE HCL 50 MG/ML IJ SOLN: 12.5000 mg | INTRAMUSCULAR | Status: DC | PRN

## 2017-02-13 MED ORDER — LIDOCAINE HCL (PF) 1 % IJ SOLN
30.0000 mL | INTRAMUSCULAR | Status: DC | PRN
  Filled 2017-02-13: qty 30

## 2017-02-13 MED ORDER — OXYCODONE-ACETAMINOPHEN 5-325 MG PO TABS: 1.0000 | ORAL_TABLET | ORAL | Status: DC | PRN

## 2017-02-13 MED ORDER — ACETAMINOPHEN 325 MG PO TABS: 650.0000 mg | ORAL_TABLET | ORAL | Status: DC | PRN

## 2017-02-13 MED ORDER — OXYTOCIN 40 UNITS IN LACTATED RINGERS INFUSION - SIMPLE MED
1.0000 m[IU]/min | INTRAVENOUS | Status: DC
  Administered 2017-02-13: 1 m[IU]/min via INTRAVENOUS
  Filled 2017-02-13: qty 1000

## 2017-02-13 MED ORDER — ONDANSETRON HCL 4 MG/2ML IJ SOLN: 4.0000 mg | Freq: Four times a day (QID) | INTRAMUSCULAR | Status: DC | PRN

## 2017-02-13 MED ORDER — OXYTOCIN 40 UNITS IN LACTATED RINGERS INFUSION - SIMPLE MED: 2.5000 [IU]/h | INTRAVENOUS | Status: DC

## 2017-02-13 MED ORDER — PENICILLIN G POT IN DEXTROSE 60000 UNIT/ML IV SOLN
3.0000 10*6.[IU] | INTRAVENOUS | Status: DC
  Administered 2017-02-14: 3 10*6.[IU] via INTRAVENOUS
  Filled 2017-02-13 (×4): qty 50

## 2017-02-13 MED ORDER — LACTATED RINGERS IV SOLN
500.0000 mL | Freq: Once | INTRAVENOUS | Status: AC
  Administered 2017-02-13: 500 mL via INTRAVENOUS

## 2017-02-13 MED ORDER — TERBUTALINE SULFATE 1 MG/ML IJ SOLN
0.2500 mg | Freq: Once | INTRAMUSCULAR | Status: DC | PRN
  Filled 2017-02-13: qty 1

## 2017-02-13 MED ORDER — PHENYLEPHRINE 40 MCG/ML (10ML) SYRINGE FOR IV PUSH (FOR BLOOD PRESSURE SUPPORT)
80.0000 ug | PREFILLED_SYRINGE | INTRAVENOUS | Status: DC | PRN
  Filled 2017-02-13: qty 5
  Filled 2017-02-13: qty 10

## 2017-02-13 MED ORDER — FENTANYL 2.5 MCG/ML BUPIVACAINE 1/10 % EPIDURAL INFUSION (WH - ANES)
14.0000 mL/h | INTRAMUSCULAR | Status: DC | PRN
  Administered 2017-02-13: 14 mL/h via EPIDURAL
  Filled 2017-02-13: qty 100

## 2017-02-13 MED ORDER — FENTANYL 2.5 MCG/ML BUPIVACAINE 1/10 % EPIDURAL INFUSION (WH - ANES): 14.0000 mL/h | INTRAMUSCULAR | Status: DC | PRN

## 2017-02-13 MED ORDER — LACTATED RINGERS IV SOLN: 500.0000 mL | INTRAVENOUS | Status: DC | PRN

## 2017-02-13 NOTE — H&P (Signed)
Bridget Leach is a 26 y.o. female G3P0111, 5433w2d gestation determined by US who presents with active labor.  She reports contractions that began last week. She reports that the contractions have progressively increased in frequency and intensity since, and are localized in her lower abdominal area, but seem to be more intense on her left side.  Last night around 0200, she woke up to use the bathroom and noticed a mucus discharge streaked with blood, that has continued throughout the day.  She denies any nausea, vomiting, vaginal pain, headaches, blurry vision, RUQ pain, or calf/leg pain. Pertinent medical history includes a vaginal, preterm birth that was complicated by pre-eclampsia, and herpes simplex type 1 infection and is currently having mouth sores and started valtrex yesterday.  She has a hx of anemia and takes Ferrous Sulfate daily. denies hx of PID, ovarian cysts, fibroids, or PID.   Clinic [facility] Prenatal Labs  Dating US @10  wks Blood type: O/Positive/-- (02/07 1713)   Genetic Screen  AFP:too late   NIPS:Mat21: normal Antibody:Negative (02/07 1713)  Anatomic US Normal @23  wks; female fetus Rubella: 13.50 (02/07 1713)  GTT   Third trimester: WNL RPR: Non Reactive (03/29 1125)   Flu vaccine   09/2016- at work HBsAg: Negative (02/07 1713)   TDaP vaccine  12-05-16                                             Rhogam:n/a O+ HIV: Non Reactive (03/29 1125)   Baby Food         bottle                                      WUJ:WJXBJYNWGBS:Positive (05/09 0927)pos, urine  Contraception        Undecided- nexplanon GNF:AOZHYQPap:normal 10/2016  Circumcision        n/a   Pediatrician       [facility]   Support Person        Meochia- mom     OB History    Gravida Para Term Preterm AB Living   3 1 0 1 1 1    SAB TAB Ectopic Multiple Live Births   0 1 0 0 1     Past Medical History:  Diagnosis Date  . Chlamydia 06/24/15  . Herpes genitalia   . Sickle cell trait carrier 12/22/2012   Hgb AS   Past Surgical History:   Procedure Laterality Date  . DENTAL SURGERY    . DILATION AND CURETTAGE OF UTERUS     Family History: family history includes Cancer in her maternal grandmother; Diabetes in her maternal grandfather and maternal grandmother; Hypertension in her maternal grandfather and maternal grandmother. Social History:  reports that she has never smoked. She has never used smokeless tobacco. She reports that she does not drink alcohol or use drugs.     Maternal Diabetes: No Genetic Screening: Normal Maternal Ultrasounds/Referrals: Normal Fetal Ultrasounds or other Referrals:  None Maternal Substance Abuse:  No Significant Maternal Medications:  None Significant Maternal Lab Results:  None Other Comments:  None  Review of Systems  Eyes: Negative for blurred vision.  Respiratory: Negative for shortness of breath.   Cardiovascular: Negative for chest pain.  Gastrointestinal: Positive for abdominal pain. Negative for nausea and vomiting.  Neurological: Negative for  weakness.   Maternal Medical History:  Reason for admission: Contractions.  Nausea.  Contractions: Onset was 1 week or more ago.   Frequency: regular.   Duration is approximately 60 seconds.   Perceived severity is mild.    Fetal activity: Perceived fetal activity is normal.   Last perceived fetal movement was within the past hour.      Dilation: 5 Effacement (%): 70 Station: -3, -2 Exam by:: Charna Archer, RN Blood pressure (!) 145/98, pulse 77, temperature 98.5 F (36.9 C), temperature source Oral, resp. rate 18, height 5\' 3"  (1.6 m), weight 82.1 kg (181 lb), last menstrual period 06/25/2016, SpO2 100 %, unknown if currently breastfeeding.   Maternal Exam:  Uterine Assessment: Contraction strength is mild.  Contraction frequency is regular.   Abdomen: Patient reports the following abdominal tenderness: suprapubic, LLQ and RLQ.    Fetal Exam Fetal Monitor Review: Mode: ultrasound.   Variability: minimal (<5 bpm).    Pattern: accelerations present and no decelerations.    Fetal State Assessment: Category II - tracings are indeterminate.     Physical Exam  Constitutional: She is oriented to person, place, and time. She appears well-developed and well-nourished.  Cardiovascular: Normal rate, regular rhythm and normal heart sounds.   Respiratory: Effort normal and breath sounds normal.  GI: There is tenderness in the right lower quadrant, suprapubic area and left lower quadrant.  Neurological: She is alert and oriented to person, place, and time.  Skin: Skin is warm and dry.  Psychiatric: She has a normal mood and affect. Her behavior is normal.    Prenatal labs: ABO, Rh: O/Positive/-- (02/07 1713) Antibody: Negative (02/07 1713) Rubella: 13.50 (02/07 1713) RPR: Non Reactive (03/29 1125)  HBsAg: Negative (02/07 1713)  HIV: Non Reactive (03/29 1125)  GBS: Positive (05/10 0000)   Assessment/Plan:  26 y.o., [name] @ [redacted]w[redacted]d   Contractions  MOC: Nexplanon Feeding: Bottle FWB: Category II tracing Circumcision: N/A  Continue to monitor patient, anticipate vaginal delivery     Jeanann Lewandowsky PA-S 02/13/2017, 7:18 PM  I confirm that I have verified the information documented in the physician assistant student's note and that I have also personally performed the physical exam and all medical decision making activities.Preeclampsia labs added on admission for borderline BPs and hx of preeclampsia with previous pregnancy.    Sharen Counter, CNM 8:53 PM

## 2017-02-13 NOTE — Progress Notes (Signed)
Labor Progress Note  Bridget Leach is a 26 y.o. [id number] at 4311w2d  admitted for spontaneous onset of labor.  Noted to have blood pressure elevated at 145/98 on admission and so preE labs were sent. BP now improved at 99/85.  Patient comfortable with epidural.  Foley placed @2100 .    O:  BP 99/85   Pulse 81   Temp 98.5 F (36.9 C) (Oral)   Resp 18   Ht 5\' 3"  (1.6 m)   Wt 82.1 kg (181 lb)   LMP 06/25/2016 (Approximate)   SpO2 100%   BMI 32.06 kg/m   FHT:  FHR: 120 bpm, variability: moderate,  accelerations:  Present,  decelerations:  Absent UC:   Occasional  SVE:   Dilation: 5.5 Effacement (%): 70 Station: -1, -2 Exam by:: Cletis MediaK. Anderson, RN  Labs: Lab Results  Component Value Date   WBC 8.5 02/13/2017   HGB 11.3 (L) 02/13/2017   HCT 33.6 (L) 02/13/2017   MCV 71.6 (L) 02/13/2017   PLT 128 (L) 02/13/2017   CMP Latest Ref Rng & Units 02/13/2017    Glucose 65 - 99 mg/dL 81    BUN 6 - 20 mg/dL 9    Creatinine 3.870.44 - 1.00 mg/dL 5.640.70    Sodium 332135 - 951145 mmol/L 137    Potassium 3.5 - 5.1 mmol/L 3.5    Chloride 101 - 111 mmol/L 106    CO2 22 - 32 mmol/L 22    Calcium 8.9 - 10.3 mg/dL 9.2    Total Protein 6.5 - 8.1 g/dL 6.9    Total Bilirubin 0.3 - 1.2 mg/dL 0.6    Alkaline Phos 38 - 126 U/L 128(H)    AST 15 - 41 U/L 18    ALT 14 - 54 U/L 20       Assessment / Plan: 26 y.o. [id number] 6611w2d in early/active labor Spontaneous labor, progressing normally  Labor: Progressing normally Fetal Wellbeing:  Category I Pain Control:  Epidural Anticipated MOD:  NSVD  Expectant management   Howard PouchLauren Filicia Scogin, MD PGY-1 [facility]

## 2017-02-13 NOTE — Anesthesia Procedure Notes (Signed)
Epidural Patient location during procedure: OB Start time: 02/13/2017 8:00 PM End time: 02/13/2017 8:08 PM  Staffing Anesthesiologist: Chaney MallingHODIERNE, Leyli Kevorkian Performed: anesthesiologist   Preanesthetic Checklist Completed: patient identified, site marked, pre-op evaluation, timeout performed, IV checked, risks and benefits discussed and monitors and equipment checked  Epidural Patient position: sitting Prep: DuraPrep Patient monitoring: heart rate, cardiac monitor, continuous pulse ox and blood pressure Approach: midline Location: L2-L3 Injection technique: LOR saline  Needle:  Needle type: Tuohy  Needle gauge: 17 G Needle length: 9 cm Needle insertion depth: 7 cm Catheter type: closed end flexible Catheter size: 19 Gauge Catheter at skin depth: 12 cm Test dose: negative and Other  Assessment Events: blood not aspirated, injection not painful, no injection resistance and negative IV test  Additional Notes Informed consent obtained prior to proceeding including risk of failure, 1% risk of PDPH, risk of minor discomfort and bruising.  Discussed rare but serious complications including epidural abscess, permanent nerve injury, epidural hematoma.  Discussed alternatives to epidural analgesia and patient desires to proceed.  Timeout performed pre-procedure verifying patient name, procedure, and platelet count.  Patient tolerated procedure well. Reason for block:procedure for pain

## 2017-02-13 NOTE — Anesthesia Pain Management Evaluation Note (Signed)
  CRNA Pain Management Visit Note  Patient: Bridget Leach, 26 y.o., female  "Hello I am a [facility]. We have an anesthesia team available at all times to provide care throughout the hospital, including epidural management and anesthesia for C-section. I don't know your plan for the delivery whether it a natural birth, water birth, IV sedation, nitrous supplementation, doula or epidural, but we want to meet your pain goals."   1.Was your pain managed to your expectations on prior hospitalizations?   Yes   2.What is your expectation for pain management during this hospitalization?     Epidural  3.How can we help you reach that goal? Pt already has epidural in place and is resting comfortably.  Record the patient's initial score and the patient's pain goal.   Pain: 0  Pain Goal: 0 [facility] wants you to be able to say your pain was always managed very well.  Celita Aron 02/13/2017

## 2017-02-13 NOTE — MAU Note (Signed)
Pt reports she has noticed some fluid and blood coming out.Reprots ctx on and off all week.(5-10 min apart) Good fetal movement felt.

## 2017-02-13 NOTE — Progress Notes (Signed)
AROM @2345  light mec  Dilation: 9 Effacement (%): 90 Cervical Position: Middle Station: +1 Presentation: Vertex    FHT: 120 bpm, minimal var, +accels, no decels TOCO: q254min   A/P: Pain: epidural FWB: Category II tracing, Dr. Shawnie PonsPratt reviewed the strip   Continue expectant management Anticipate SVD Howard PouchLauren [place] Severs, MD PGY-1 [facility]

## 2017-02-13 NOTE — Anesthesia Preprocedure Evaluation (Signed)
Anesthesia Evaluation  Patient identified by MRN, date of birth, ID band Patient awake    Reviewed: Allergy & Precautions, H&P , NPO status , Patient's Chart, lab work & pertinent test results  Airway Mallampati: II   Neck ROM: full    Dental   Pulmonary neg pulmonary ROS,    breath sounds clear to auscultation       Cardiovascular negative cardio ROS   Rhythm:regular Rate:Normal     Neuro/Psych    GI/Hepatic   Endo/Other    Renal/GU      Musculoskeletal   Abdominal   Peds  Hematology  (+) Blood dyscrasia, Sickle cell trait ,   Anesthesia Other Findings   Reproductive/Obstetrics (+) Pregnancy                             Anesthesia Physical Anesthesia Plan  ASA: II  Anesthesia Plan: Epidural   Post-op Pain Management:    Induction:   PONV Risk Score and Plan:   Airway Management Planned: Natural Airway  Additional Equipment:   Intra-op Plan:   Post-operative Plan:   Informed Consent: I have reviewed the patients History and Physical, chart, labs and discussed the procedure including the risks, benefits and alternatives for the proposed anesthesia with the patient or authorized representative who has indicated his/her understanding and acceptance.     Plan Discussed with: Anesthesiologist  Anesthesia Plan Comments:         Anesthesia Quick Evaluation

## 2017-02-13 NOTE — Progress Notes (Addendum)
G3P1 @ 39.[redacted] wksga. Presents to triage for r/o labor. States ctx since 0200 in the morning with small amt of bleeding and possible SROM. States clear mucus bloody show noted just before coming in to unit. +FM. EFM applied.   SVE: 5/70/BB  1820: Provider notified. Report status of pt given. Orders received to admit pt.   1830: Labs and IV done.   1839: Press photographerCharge nurse in birthing notified. Report given by MAU nurse. Room assigned to 160.   1840: Pt to birthing via wheelchair.

## 2017-02-14 ENCOUNTER — Encounter (HOSPITAL_COMMUNITY): Payer: Self-pay

## 2017-02-14 DIAGNOSIS — O99824 Streptococcus B carrier state complicating childbirth: Secondary | ICD-10-CM

## 2017-02-14 DIAGNOSIS — Z3A39 39 weeks gestation of pregnancy: Secondary | ICD-10-CM

## 2017-02-14 LAB — RPR: RPR: NONREACTIVE

## 2017-02-14 MED ORDER — TETANUS-DIPHTH-ACELL PERTUSSIS 5-2.5-18.5 LF-MCG/0.5 IM SUSP: 0.5000 mL | Freq: Once | INTRAMUSCULAR | Status: DC

## 2017-02-14 MED ORDER — ONDANSETRON HCL 4 MG/2ML IJ SOLN: 4.0000 mg | INTRAMUSCULAR | Status: DC | PRN

## 2017-02-14 MED ORDER — ACETAMINOPHEN 325 MG PO TABS
650.0000 mg | ORAL_TABLET | ORAL | Status: DC | PRN
  Administered 2017-02-14: 650 mg via ORAL
  Filled 2017-02-14: qty 2

## 2017-02-14 MED ORDER — PRENATAL MULTIVITAMIN CH
1.0000 | ORAL_TABLET | Freq: Every day | ORAL | Status: DC
  Administered 2017-02-14 – 2017-02-16 (×3): 1 via ORAL
  Filled 2017-02-14 (×3): qty 1

## 2017-02-14 MED ORDER — SENNOSIDES-DOCUSATE SODIUM 8.6-50 MG PO TABS
2.0000 | ORAL_TABLET | ORAL | Status: DC
  Administered 2017-02-14 – 2017-02-15 (×2): 2 via ORAL
  Filled 2017-02-14 (×2): qty 2

## 2017-02-14 MED ORDER — COCONUT OIL OIL: 1.0000 "application " | TOPICAL_OIL | Status: DC | PRN

## 2017-02-14 MED ORDER — ZOLPIDEM TARTRATE 5 MG PO TABS
5.0000 mg | ORAL_TABLET | Freq: Every evening | ORAL | Status: DC | PRN
Start: 2017-02-14 — End: 2017-02-16

## 2017-02-14 MED ORDER — DIBUCAINE 1 % RE OINT: 1.0000 "application " | TOPICAL_OINTMENT | RECTAL | Status: DC | PRN

## 2017-02-14 MED ORDER — SIMETHICONE 80 MG PO CHEW: 80.0000 mg | CHEWABLE_TABLET | ORAL | Status: DC | PRN

## 2017-02-14 MED ORDER — IBUPROFEN 600 MG PO TABS
600.0000 mg | ORAL_TABLET | Freq: Four times a day (QID) | ORAL | Status: DC
  Administered 2017-02-14 – 2017-02-16 (×10): 600 mg via ORAL
  Filled 2017-02-14 (×9): qty 1

## 2017-02-14 MED ORDER — BENZOCAINE-MENTHOL 20-0.5 % EX AERO
1.0000 "application " | INHALATION_SPRAY | CUTANEOUS | Status: DC | PRN
  Administered 2017-02-14: 1 via TOPICAL
  Filled 2017-02-14: qty 56

## 2017-02-14 MED ORDER — ONDANSETRON HCL 4 MG PO TABS
4.0000 mg | ORAL_TABLET | ORAL | Status: DC | PRN
Start: 2017-02-14 — End: 2017-02-16

## 2017-02-14 MED ORDER — DIPHENHYDRAMINE HCL 25 MG PO CAPS: 25.0000 mg | ORAL_CAPSULE | Freq: Four times a day (QID) | ORAL | Status: DC | PRN

## 2017-02-14 MED ORDER — WITCH HAZEL-GLYCERIN EX PADS: 1.0000 "application " | MEDICATED_PAD | CUTANEOUS | Status: DC | PRN

## 2017-02-14 NOTE — Progress Notes (Signed)
CSW received consult for hx of marijuana use.  Referral was screened out due to the following: ~MOB had no documented substance use after initial prenatal visit/+UPT. ~MOB had no positive drug screens after initial prenatal visit/+UPT. ~Baby's UDS is negative.  Please consult CSW if current concerns arise or by MOB's request.  CSW will monitor CDS results and make report to Child Protective Services if warranted. 

## 2017-02-14 NOTE — Anesthesia Postprocedure Evaluation (Signed)
Anesthesia Post Note  Patient: Bridget BostonShacarrah Leach  Procedure(s) Performed: * No procedures listed *     Patient location during evaluation: Mother Baby Anesthesia Type: Epidural Level of consciousness: awake and alert and oriented Pain management: satisfactory to patient Vital Signs Assessment: post-procedure vital signs reviewed and stable Respiratory status: spontaneous breathing and nonlabored ventilation Cardiovascular status: stable Postop Assessment: no headache, no backache, no signs of nausea or vomiting, adequate PO intake and patient able to bend at knees (patient up walking) Anesthetic complications: no    Last Vitals:  Vitals:   02/14/17 0230 02/14/17 0330  BP: 127/65 118/78  Pulse: 70 (!) 59  Resp: 18 20  Temp: 36.7 C 36.7 C    Last Pain:  Vitals:   02/14/17 0600  TempSrc:   PainSc: 6    Pain Goal: Patients Stated Pain Goal: 2 (02/14/17 0600)               Madison HickmanGREGORY,Kimbly Eanes

## 2017-02-15 NOTE — Progress Notes (Signed)
POSTPARTUM PROGRESS NOTE  Post Partum Day #1  Subjective:  Bridget Leach is a 26 y.o. [id number] 6268w3d s/p SVD.  No acute events overnight.  Pt denies problems with ambulating, voiding or po intake.  She denies nausea or vomiting.  Pain is well controlled.  She has had flatus. She has not had bowel movement.  Lochia Minimal.   Objective: Blood pressure 131/86, pulse 64, temperature 98.3 F (36.8 C), temperature source Oral, resp. rate 16, height 5\' 3"  (1.6 m), weight 82.1 kg (181 lb), last menstrual period 06/25/2016, SpO2 100 %, unknown if currently breastfeeding.  Physical Exam:  General: alert, cooperative and no distress Lochia:normal flow Abdomen: +BS, soft, nontender Uterine Fundus: firm, below umbilicus DVT Evaluation: No calf swelling or tenderness Extremities: no edema   Recent Labs  02/13/17 1830  HGB 11.3*  HCT 33.6*    Assessment/Plan:  ASSESSMENT: Bridget Leach is a 26 y.o. [id number] 6668w3d s/p SVD  Baby under bili lights. Patient would like to go today if baby DC'd today.   LOS: 2 days   Howard PouchLauren Judge Duque, MD PGY-1 [facility]  02/15/2017, 7:20 AM

## 2017-02-15 NOTE — Discharge Instructions (Signed)

## 2017-02-16 DIAGNOSIS — A749 Chlamydial infection, unspecified: Secondary | ICD-10-CM | POA: Insufficient documentation

## 2017-02-16 MED ORDER — IBUPROFEN 600 MG PO TABS: 600.0000 mg | ORAL_TABLET | Freq: Four times a day (QID) | ORAL | 0 refills | Status: DC

## 2017-02-16 MED ORDER — SENNOSIDES-DOCUSATE SODIUM 8.6-50 MG PO TABS: 2.0000 | ORAL_TABLET | Freq: Every evening | ORAL | 0 refills | Status: DC | PRN

## 2017-02-16 NOTE — Discharge Summary (Signed)
OB Discharge Summary     Patient Name: Bridget Leach DOB: Sep 08, 1991 MRN: [id number]  Date of admission: 02/13/2017 Delivering MD: Howard Pouch   Date of discharge: 02/16/2017  Admitting diagnosis: 39WKS CTX 5-10MINS APART, LEAKING FLUID, LIGHT BLEEDING Intrauterine pregnancy: [redacted]w[redacted]d     Secondary diagnosis:  Active Problems:   Labor and delivery, indication for care  Additional problems: prior Marijuana use, stopped     Discharge diagnosis: Term Pregnancy Delivered                                                                                                Post partum procedures:none  Augmentation: AROM  Complications: None  Hospital course:  Onset of Labor With Vaginal Delivery     26 y.o. yo [id number] at [redacted]w[redacted]d was admitted in Active Labor on 02/13/2017. Patient had an uncomplicated labor course as follows:  Membrane Rupture Time/Date: 11:44 PM ,02/13/2017   Intrapartum Procedures: Episiotomy: None [1]                                         Lacerations:  Labial [10]  Patient had a delivery of a Viable infant. 02/14/2017  Information for the patient's newborn:  Bridget, Leach [[id number]]  Delivery Method: Vag-Spont    Pateint had an uncomplicated postpartum course.  She is ambulating, tolerating a regular diet, passing flatus, and urinating well. Patient is discharged home in stable condition on 02/16/17.   Physical exam  Vitals:   02/14/17 1645 02/15/17 0514 02/15/17 1800 02/16/17 0514  BP:  131/86 117/81 120/85  Pulse:  64 81 81  Resp:  16 17 18   Temp:  98.3 F (36.8 C) 98.3 F (36.8 C) 98.7 F (37.1 C)  TempSrc:  Oral Oral Oral  SpO2: 100% 100% 100%   Weight:      Height:       General: alert, cooperative and no distress Lochia: appropriate Uterine Fundus: firm Incision: Healing well with no significant drainage DVT Evaluation: No evidence of DVT seen on physical exam. Labs: Lab Results  Component Value Date   WBC 8.5 02/13/2017   HGB 11.3 (L)  02/13/2017   HCT 33.6 (L) 02/13/2017   MCV 71.6 (L) 02/13/2017   PLT 128 (L) 02/13/2017   CMP Latest Ref Rng & Units 02/13/2017  Glucose 65 - 99 mg/dL 81  BUN 6 - 20 mg/dL 9  Creatinine 1.30 - 8.65 mg/dL 7.84  Sodium 696 - 295 mmol/L 137  Potassium 3.5 - 5.1 mmol/L 3.5  Chloride 101 - 111 mmol/L 106  CO2 22 - 32 mmol/L 22  Calcium 8.9 - 10.3 mg/dL 9.2  Total Protein 6.5 - 8.1 g/dL 6.9  Total Bilirubin 0.3 - 1.2 mg/dL 0.6  Alkaline Phos 38 - 126 U/L 128(H)  AST 15 - 41 U/L 18  ALT 14 - 54 U/L 20    Discharge instruction: per After Visit Summary and "Baby and Me Booklet".  After visit meds:  Allergies as of 02/16/2017  No Known Allergies     Medication List    STOP taking these medications   aspirin 81 MG chewable tablet     TAKE these medications   butalbital-acetaminophen-caffeine 50-325-40 MG tablet Commonly known as:  FIORICET, ESGIC Take 1-2 tablets by mouth every 6 (six) hours as needed.   CITRANATAL BLOOM 90-1 MG Tabs Take 1 tablet by mouth daily.   ferrous sulfate 325 (65 FE) MG tablet Take 1 tablet (325 mg total) by mouth daily with breakfast.   flintstones complete 60 MG chewable tablet Chew 2 tablets by mouth daily.   ibuprofen 600 MG tablet Commonly known as:  ADVIL,MOTRIN Take 1 tablet (600 mg total) by mouth every 6 (six) hours.   senna-docusate 8.6-50 MG tablet Commonly known as:  Senokot-S Take 2 tablets by mouth at bedtime as needed for mild constipation.   valACYclovir 500 MG tablet Commonly known as:  VALTREX Take 1 tablet (500 mg total) by mouth 2 (two) times daily.   Vitamin D (Ergocalciferol) 50000 units Caps capsule Commonly known as:  DRISDOL Take 1 capsule (50,000 Units total) by mouth every 7 (seven) days.       Diet: routine diet  Activity: Advance as tolerated. Pelvic rest for 6 weeks.   Outpatient follow up:6 weeks Follow up Appt:Future Appointments Date Time Provider Department Center  04/10/2017 12:00 PM [facility]  LAB 3 [facility] None  04/11/2017 11:00 AM Serena CroissantGudena, Vinay, [facility]   Follow up Visit:No Follow-up on file.  Postpartum contraception: Nexplanon  Newborn Data: Live born female  Birth Weight: 6 lb 3.3 oz (2815 g) APGAR: 8, 9  Baby Feeding: Bottle Disposition:rooming in due to hyperbilirubinemia   02/16/2017 Wynelle BourgeoisMarie Henretta Quist, CNM

## 2017-02-19 ENCOUNTER — Encounter: Payer: Medicaid Other | Admitting: Obstetrics & Gynecology

## 2017-02-25 ENCOUNTER — Inpatient Hospital Stay (HOSPITAL_COMMUNITY): Payer: No Typology Code available for payment source

## 2017-03-05 ENCOUNTER — Telehealth: Payer: Self-pay

## 2017-03-05 ENCOUNTER — Encounter (HOSPITAL_COMMUNITY): Payer: Self-pay | Admitting: Family Medicine

## 2017-03-05 ENCOUNTER — Emergency Department (HOSPITAL_COMMUNITY)
Admission: EM | Admit: 2017-03-05 | Discharge: 2017-03-05 | Disposition: A | Discharge status: Home / Self Care | Payer: Medicaid Other | Attending: Emergency Medicine | Admitting: Emergency Medicine

## 2017-03-05 DIAGNOSIS — R21 Rash and other nonspecific skin eruption: Secondary | ICD-10-CM

## 2017-03-05 DIAGNOSIS — L509 Urticaria, unspecified: Secondary | ICD-10-CM | POA: Insufficient documentation

## 2017-03-05 MED ORDER — TRIAMCINOLONE ACETONIDE 0.1 % EX CREA: 1.0000 "application " | TOPICAL_CREAM | Freq: Two times a day (BID) | CUTANEOUS | 0 refills | Status: DC

## 2017-03-05 MED ORDER — DIPHENHYDRAMINE HCL 25 MG PO CAPS
50.0000 mg | ORAL_CAPSULE | Freq: Once | ORAL | Status: AC
  Administered 2017-03-05: 50 mg via ORAL
  Filled 2017-03-05: qty 2

## 2017-03-05 NOTE — ED Notes (Signed)
Pt left without receiving discharge instructions. ?

## 2017-03-05 NOTE — ED Provider Notes (Signed)
[facility]-EMERGENCY DEPT Provider Note   CSN: [id number] Arrival date & time: 03/05/17  1949     History   Chief Complaint Chief Complaint  Patient presents with  . Rash    HPI Bridget Leach is a 26 y.o. female 3 weeks postpartum presenting with pruritic rash that started on Monday. Initially on her chest and then spread to her torso and leg. She was seen at urgent care and prescribed prednisone and Benadryl with improvement. She ran out of Benadryl and symptoms returned. Overall she states that the rash is improved. She reports using a new deodorant otherwise no new detergent, lotion, soaps. No exposure to vegetation or insects. She has been indoors caring for her baby. She denies difficulty breathing, throat tightening, swelling, pain, fever, chills, nausea, vomiting or other symptoms.  HPI  Past Medical History:  Diagnosis Date  . Chlamydia 06/24/15  . Herpes genitalia   . Sickle cell trait carrier 12/22/2012   Hgb AS    Patient Active Problem List   Diagnosis Date Noted  . Chlamydial infection 02/16/2017  . Labor and delivery, indication for care 02/13/2017  . Iron deficiency anemia 01/09/2017  . Low vitamin D level 10/21/2016  . Anemia of pregnancy 10/21/2016  . Supervision of other normal pregnancy, antepartum 10/16/2016  . Late prenatal care 10/16/2016  . GBS bacteriuria 07/24/2016  . 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 Hx of preeclampsia, prior pregnancy, currently pregnant 10/12/2015  . Genital herpes simplex 10/12/2015  . Cannabis abuse 10/12/2015  . Anemia 10/12/2015  . Sickle cell trait carrier 12/22/2012    Past Surgical History:  Procedure Laterality Date  . DENTAL SURGERY    . DILATION AND CURETTAGE OF UTERUS      OB History    Gravida Para Term Preterm AB Living   3 2 1 1 1 2    SAB TAB Ectopic Multiple Live Births   0 1 0 0 2       Home Medications    Prior to Admission medications   Medication Sig Start Date End Date Taking?  Authorizing Provider  butalbital-acetaminophen-caffeine (FIORICET, ESGIC) 50-325-40 MG tablet Take 1-2 tablets by mouth every 6 (six) hours as needed. Patient not taking: Reported on 01/15/2017 10/16/16   Orvilla Cornwallenney, Rachelle A, CNM  ferrous sulfate 325 (65 FE) MG tablet Take 1 tablet (325 mg total) by mouth daily with breakfast. 01/15/17   Adam PhenixArnold, James G, MD  flintstones complete (FLINTSTONES) 60 MG chewable tablet Chew 2 tablets by mouth daily. 01/15/17   Adam PhenixArnold, James G, MD  ibuprofen (ADVIL,MOTRIN) 600 MG tablet Take 1 tablet (600 mg total) by mouth every 6 (six) hours. 02/16/17   Aviva SignsWilliams, Marie L, CNM  Prenatal-DSS-FeCb-FeGl-FA (CITRANATAL BLOOM) 90-1 MG TABS Take 1 tablet by mouth daily. Patient not taking: Reported on 02/14/2017 11/13/16   Orvilla Cornwallenney, Rachelle A, CNM  senna-docusate (SENOKOT-S) 8.6-50 MG tablet Take 2 tablets by mouth at bedtime as needed for mild constipation. 02/16/17   Aviva SignsWilliams, Marie L, CNM  triamcinolone cream (KENALOG) 0.1 % Apply 1 application topically 2 (two) times daily. 03/05/17   Georgiana ShoreMitchell, Edie Darley B, PA-C  valACYclovir (VALTREX) 500 MG tablet Take 1 tablet (500 mg total) by mouth 2 (two) times daily. 01/22/17   Hermina StaggersErvin, Michael L, MD  Vitamin D, Ergocalciferol, (DRISDOL) 50000 units CAPS capsule Take 1 capsule (50,000 Units total) by mouth every 7 (seven) days. Patient not taking: Reported on 01/15/2017 10/21/16   Roe Coombsenney, Rachelle A, CNM    Family History Family  History  Problem Relation Age of Onset  . Diabetes Maternal Grandmother   . Hypertension Maternal Grandmother   . Cancer Maternal Grandmother        leukemia  . Diabetes Maternal Grandfather   . Hypertension Maternal Grandfather     Social History Social History  Substance Use Topics  . Smoking status: Never Smoker  . Smokeless tobacco: Never Used  . Alcohol use No     Allergies   Patient has no known allergies.   Review of Systems Review of Systems  Constitutional: Negative for chills and fever.  HENT:  Negative for congestion, ear pain, facial swelling, mouth sores and sore throat.   Eyes: Negative for pain and visual disturbance.  Respiratory: Negative for cough, choking, chest tightness, shortness of breath, wheezing and stridor.   Cardiovascular: Negative for chest pain and palpitations.  Gastrointestinal: Negative for abdominal pain, nausea and vomiting.  Genitourinary: Negative for dysuria and hematuria.  Musculoskeletal: Negative for arthralgias, back pain, myalgias, neck pain and neck stiffness.  Skin: Positive for rash. Negative for color change, pallor and wound.       Pruritic rash  Neurological: Negative for dizziness, seizures, syncope, light-headedness and headaches.     Physical Exam Updated Vital Signs BP (!) 144/97   Pulse 79   Temp 97.6 F (36.4 C) (Oral)   Resp 18   Ht 5\' 4"  (1.626 m)   Wt 77.6 kg (171 lb)   SpO2 100%   BMI 29.35 kg/m   Physical Exam  Constitutional: She appears well-developed and well-nourished. No distress.  Afebrile, nontoxic-appearing, sitting comfortably in chair in no acute distress.  HENT:  Head: Normocephalic and atraumatic.  Mouth/Throat: Oropharynx is clear and moist. No oropharyngeal exudate.  No oral lesions  Eyes: Conjunctivae and EOM are normal. Right eye exhibits no discharge. Left eye exhibits no discharge.  Neck: Normal range of motion. Neck supple.  Cardiovascular: Normal rate, regular rhythm and normal heart sounds.   No murmur heard. Pulmonary/Chest: Effort normal and breath sounds normal. No stridor. No respiratory distress. She has no wheezes. She has no rales. She exhibits no tenderness.  Abdominal: She exhibits no distension.  Musculoskeletal: Normal range of motion. She exhibits no edema.  Neurological: She is alert.  Skin: Skin is warm and dry. Rash noted. She is not diaphoretic. No erythema. No pallor.  Faint small uticarial rash on her chest, arms, thighs, palms.  Psychiatric: She has a normal mood and affect.   Nursing note and vitals reviewed.    ED Treatments / Results  Labs (all labs ordered are listed, but only abnormal results are displayed) Labs Reviewed - No data to display  EKG  EKG Interpretation None       Radiology No results found.  Procedures Procedures (including critical care time)  Medications Ordered in ED Medications  diphenhydrAMINE (BENADRYL) capsule 50 mg (50 mg Oral Given 03/05/17 2136)     Initial Impression / Assessment and Plan / ED Course  I have reviewed the triage vital signs and the nursing notes.  Pertinent labs & imaging results that were available during my care of the patient were reviewed by me and considered in my medical decision making (see chart for details).    Patient presents with faint mild uticarial rash. Lungs are CTA bilaterally, no distress and 100% SPO2 on room air.  Overall reassuring exam, no signs of systemic infection. The rash has been improving with steroids and Benadryl. Patient started experiencing symptoms again after she  ran out of Benadryl at home. Currently taking prednisone.  Given benadryl in ED.  Will discharge home with triamcinolone cream and continued antihistamines and prednisone with follow-up with primary care for possible allergy skin testing in the future.  On reassessment, patient reported significant improvement and no pruritus.  Discussed strict return precautions and advised to return to the emergency department if experiencing any new or worsening symptoms. Instructions were understood and patient agreed with discharge plan.  Final Clinical Impressions(s) / ED Diagnoses   Final diagnoses:  Rash    New Prescriptions New Prescriptions   TRIAMCINOLONE CREAM (KENALOG) 0.1 %    Apply 1 application topically 2 (two) times daily.     Georgiana Shore, PA-C 03/05/17 2244    Tilden Fossa, MD 03/06/17 [phone]

## 2017-03-05 NOTE — ED Triage Notes (Signed)
Patient reports she developed a generalized rash on Monday. Pt reports she went to Urgent Care on Monday. Given an unknown steroid and BENADRYL injection that helped for about 30 minutes. Rash has got worse. Complains it itches.

## 2017-03-05 NOTE — Discharge Instructions (Signed)
As discussed, continue to use your take your medications and try the steroid cream to help with the itching and rash.  Follow up with your Primary Care doctor who may decide to refer you for allergy skin testing. Use mild soaps and scent free detergent.  Return if symptoms worsen or you have difficulty breathing or other concerning symptoms in the meantime.

## 2017-03-05 NOTE — Telephone Encounter (Signed)
Returned pt's call. Received VM from pt. She did not indicate reason for the call. LVM for pt to c/b

## 2017-03-17 ENCOUNTER — Encounter: Payer: Self-pay | Admitting: *Deleted

## 2017-03-17 ENCOUNTER — Ambulatory Visit (INDEPENDENT_AMBULATORY_CARE_PROVIDER_SITE_OTHER): Payer: Medicaid Other | Admitting: Obstetrics and Gynecology

## 2017-03-17 ENCOUNTER — Encounter: Payer: Self-pay | Admitting: Obstetrics and Gynecology

## 2017-03-17 DIAGNOSIS — Z3049 Encounter for surveillance of other contraceptives: Secondary | ICD-10-CM

## 2017-03-17 DIAGNOSIS — Z30017 Encounter for initial prescription of implantable subdermal contraceptive: Secondary | ICD-10-CM | POA: Insufficient documentation

## 2017-03-17 MED ORDER — ETONOGESTREL 68 MG ~~LOC~~ IMPL
68.0000 mg | DRUG_IMPLANT | Freq: Once | SUBCUTANEOUS | Status: AC
  Administered 2017-03-17: 68 mg via SUBCUTANEOUS

## 2017-03-17 NOTE — Progress Notes (Signed)
Patient ID: Bridget BostonShacarrah Leach, female   DOB: Dec 14, 1990, 26 y.o.   MRN: [id number] Bridget Leach is a 26 y.o. [id number] here for Nexplanon insertion.  Last pap smear was on 10/2016 and was normal.  No other gynecologic concerns. PPV completed today as well. UPT negative  Nexplanon Insertion Procedure Patient identified, informed consent performed, consent signed.   Patient does understand that irregular bleeding is a very common side effect of this medication. She was advised to have backup contraception for one week after placement. Pregnancy test in clinic today was negative.  Appropriate time out taken.  Patient's left arm was prepped and draped in the usual sterile fashion.. The ruler used to measure and mark insertion area.  Patient was prepped with alcohol swab and then injected with 3 ml of 1% lidocaine.  She was prepped with betadine, Nexplanon removed from packaging,  Device confirmed in needle, then inserted full length of needle and withdrawn per handbook instructions. Nexplanon was able to palpated in the patient's arm; patient palpated the insert herself. There was minimal blood loss.  Patient insertion site covered with guaze and a pressure bandage to reduce any bruising.  The patient tolerated the procedure well and was given post procedure instructions.   Hermina StaggersMichael L Marelin Tat, MD, FACOG Attending Obstetrician & Gynecologist [facility], [facility]

## 2017-03-17 NOTE — Patient Instructions (Signed)
Health Maintenance, Female Adopting a healthy lifestyle and getting preventive care can go a long way to promote health and wellness. Talk with your health care provider about what schedule of regular examinations is right for you. This is a good chance for you to check in with your provider about disease prevention and staying healthy. In between checkups, there are plenty of things you can do on your own. Experts have done a lot of research about which lifestyle changes and preventive measures are most likely to keep you healthy. Ask your health care provider for more information. Weight and diet Eat a healthy diet  Be sure to include plenty of vegetables, fruits, low-fat dairy products, and lean protein.  Do not eat a lot of foods high in solid fats, added sugars, or salt.  Get regular exercise. This is one of the most important things you can do for your health. ? Most adults should exercise for at least 150 minutes each week. The exercise should increase your heart rate and make you sweat (moderate-intensity exercise). ? Most adults should also do strengthening exercises at least twice a week. This is in addition to the moderate-intensity exercise.  Maintain a healthy weight  Body mass index (BMI) is a measurement that can be used to identify possible weight problems. It estimates body fat based on height and weight. Your health care provider can help determine your BMI and help you achieve or maintain a healthy weight.  For females 20 years of age and older: ? A BMI below 18.5 is considered underweight. ? A BMI of 18.5 to 24.9 is normal. ? A BMI of 25 to 29.9 is considered overweight. ? A BMI of 30 and above is considered obese.  Watch levels of cholesterol and blood lipids  You should start having your blood tested for lipids and cholesterol at 24 years of age, then have this test every 5 years.  You may need to have your cholesterol levels checked more often if: ? Your lipid or  cholesterol levels are high. ? You are older than 24 years of age. ? You are at high risk for heart disease.  Cancer screening Lung Cancer  Lung cancer screening is recommended for adults 55-80 years old who are at high risk for lung cancer because of a history of smoking.  A yearly low-dose CT scan of the lungs is recommended for people who: ? Currently smoke. ? Have quit within the past 15 years. ? Have at least a 30-pack-year history of smoking. A pack year is smoking an average of one pack of cigarettes a day for 1 year.  Yearly screening should continue until it has been 15 years since you quit.  Yearly screening should stop if you develop a health problem that would prevent you from having lung cancer treatment.  Breast Cancer  Practice breast self-awareness. This means understanding how your breasts normally appear and feel.  It also means doing regular breast self-exams. Let your health care provider know about any changes, no matter how small.  If you are in your 20s or 30s, you should have a clinical breast exam (CBE) by a health care provider every 1-3 years as part of a regular health exam.  If you are 40 or older, have a CBE every year. Also consider having a breast X-ray (mammogram) every year.  If you have a family history of breast cancer, talk to your health care provider about genetic screening.  If you are at high risk   for breast cancer, talk to your health care provider about having an MRI and a mammogram every year.  Breast cancer gene (BRCA) assessment is recommended for women who have family members with BRCA-related cancers. BRCA-related cancers include: ? Breast. ? Ovarian. ? Tubal. ? Peritoneal cancers.  Results of the assessment will determine the need for genetic counseling and BRCA1 and BRCA2 testing.  Cervical Cancer Your health care provider may recommend that you be screened regularly for cancer of the pelvic organs (ovaries, uterus, and  vagina). This screening involves a pelvic examination, including checking for microscopic changes to the surface of your cervix (Pap test). You may be encouraged to have this screening done every 3 years, beginning at age 22.  For women ages 56-65, health care providers may recommend pelvic exams and Pap testing every 3 years, or they may recommend the Pap and pelvic exam, combined with testing for human papilloma virus (HPV), every 5 years. Some types of HPV increase your risk of cervical cancer. Testing for HPV may also be done on women of any age with unclear Pap test results.  Other health care providers may not recommend any screening for nonpregnant women who are considered low risk for pelvic cancer and who do not have symptoms. Ask your health care provider if a screening pelvic exam is right for you.  If you have had past treatment for cervical cancer or a condition that could lead to cancer, you need Pap tests and screening for cancer for at least 20 years after your treatment. If Pap tests have been discontinued, your risk factors (such as having a new sexual partner) need to be reassessed to determine if screening should resume. Some women have medical problems that increase the chance of getting cervical cancer. In these cases, your health care provider may recommend more frequent screening and Pap tests.  Colorectal Cancer  This type of cancer can be detected and often prevented.  Routine colorectal cancer screening usually begins at 24 years of age and continues through 24 years of age.  Your health care provider may recommend screening at an earlier age if you have risk factors for colon cancer.  Your health care provider may also recommend using home test kits to check for hidden blood in the stool.  A small camera at the end of a tube can be used to examine your colon directly (sigmoidoscopy or colonoscopy). This is done to check for the earliest forms of colorectal  cancer.  Routine screening usually begins at age 33.  Direct examination of the colon should be repeated every 5-10 years through 24 years of age. However, you may need to be screened more often if early forms of precancerous polyps or small growths are found.  Skin Cancer  Check your skin from head to toe regularly.  Tell your health care provider about any new moles or changes in moles, especially if there is a change in a mole's shape or color.  Also tell your health care provider if you have a mole that is larger than the size of a pencil eraser.  Always use sunscreen. Apply sunscreen liberally and repeatedly throughout the day.  Protect yourself by wearing long sleeves, pants, a wide-brimmed hat, and sunglasses whenever you are outside.  Heart disease, diabetes, and high blood pressure  High blood pressure causes heart disease and increases the risk of stroke. High blood pressure is more likely to develop in: ? People who have blood pressure in the high end of  the normal range (130-139/85-89 mm Hg). ? People who are overweight or obese. ? People who are African American.  If you are 21-29 years of age, have your blood pressure checked every 3-5 years. If you are 3 years of age or older, have your blood pressure checked every year. You should have your blood pressure measured twice-once when you are at a hospital or clinic, and once when you are not at a hospital or clinic. Record the average of the two measurements. To check your blood pressure when you are not at a hospital or clinic, you can use: ? An automated blood pressure machine at a pharmacy. ? A home blood pressure monitor.  If you are between 17 years and 37 years old, ask your health care provider if you should take aspirin to prevent strokes.  Have regular diabetes screenings. This involves taking a blood sample to check your fasting blood sugar level. ? If you are at a normal weight and have a low risk for diabetes,  have this test once every three years after 24 years of age. ? If you are overweight and have a high risk for diabetes, consider being tested at a younger age or more often. Preventing infection Hepatitis B  If you have a higher risk for hepatitis B, you should be screened for this virus. You are considered at high risk for hepatitis B if: ? You were born in a country where hepatitis B is common. Ask your health care provider which countries are considered high risk. ? Your parents were born in a high-risk country, and you have not been immunized against hepatitis B (hepatitis B vaccine). ? You have HIV or AIDS. ? You use needles to inject street drugs. ? You live with someone who has hepatitis B. ? You have had sex with someone who has hepatitis B. ? You get hemodialysis treatment. ? You take certain medicines for conditions, including cancer, organ transplantation, and autoimmune conditions.  Hepatitis C  Blood testing is recommended for: ? Everyone born from [birth year] through 1963. ? Anyone with known risk factors for hepatitis C.  Sexually transmitted infections (STIs)  You should be screened for sexually transmitted infections (STIs) including gonorrhea and chlamydia if: ? You are sexually active and are younger than 24 years of age. ? You are older than 24 years of age and your health care provider tells you that you are at risk for this type of infection. ? Your sexual activity has changed since you were last screened and you are at an increased risk for chlamydia or gonorrhea. Ask your health care provider if you are at risk.  If you do not have HIV, but are at risk, it may be recommended that you take a prescription medicine daily to prevent HIV infection. This is called pre-exposure prophylaxis (PrEP). You are considered at risk if: ? You are sexually active and do not regularly use condoms or know the HIV status of your partner(s). ? You take drugs by injection. ? You are  sexually active with a partner who has HIV.  Talk with your health care provider about whether you are at high risk of being infected with HIV. If you choose to begin PrEP, you should first be tested for HIV. You should then be tested every 3 months for as long as you are taking PrEP. Pregnancy  If you are premenopausal and you may become pregnant, ask your health care provider about preconception counseling.  If you may become  pregnant, take 400 to 800 micrograms (mcg) of folic acid every day.  If you want to prevent pregnancy, talk to your health care provider about birth control (contraception). Osteoporosis and menopause  Osteoporosis is a disease in which the bones lose minerals and strength with aging. This can result in serious bone fractures. Your risk for osteoporosis can be identified using a bone density scan.  If you are 34 years of age or older, or if you are at risk for osteoporosis and fractures, ask your health care provider if you should be screened.  Ask your health care provider whether you should take a calcium or vitamin D supplement to lower your risk for osteoporosis.  Menopause may have certain physical symptoms and risks.  Hormone replacement therapy may reduce some of these symptoms and risks. Talk to your health care provider about whether hormone replacement therapy is right for you. Follow these instructions at home:  Schedule regular health, dental, and eye exams.  Stay current with your immunizations.  Do not use any tobacco products including cigarettes, chewing tobacco, or electronic cigarettes.  If you are pregnant, do not drink alcohol.  If you are breastfeeding, limit how much and how often you drink alcohol.  Limit alcohol intake to no more than 1 drink per day for nonpregnant women. One drink equals 12 ounces of beer, 5 ounces of wine, or 1 ounces of hard liquor.  Do not use street drugs.  Do not share needles.  Ask your health care  provider for help if you need support or information about quitting drugs.  Tell your health care provider if you often feel depressed.  Tell your health care provider if you have ever been abused or do not feel safe at home. This information is not intended to replace advice given to you by your health care provider. Make sure you discuss any questions you have with your health care provider. Document Released: 03/11/2011 Document Revised: 02/01/2016 Document Reviewed: 05/30/2015 Elsevier Interactive Patient Education  2018 [employer]. Contraceptive Implant Information A contraceptive implant is a small, plastic rod that is inserted under the skin. The implant releases a hormone into the bloodstream that prevents pregnancy. Contraceptive implants can be effective for up to 3 years. They do not provide protection against STIs (sexually transmitted infections). How does the implant work? Contraceptive implants prevent pregnancy by releasing a small amount of progestin into the bloodstream. Progestin has similar effects to the hormone progesterone, which plays a role in menstrual periods and pregnancy. Progestin will:  Stop the ovaries from releasing eggs.  Thicken cervical mucus to prevent sperm from entering the cervix.  Thin out the lining of the uterus to prevent a fertilized egg from attaching to the wall of the uterus.  What are the advantages of this form of birth control? The advantages of this form of birth control include the following:  It is very effective at preventing pregnancy.  It is effective for up to 3 years.  It can easily be removed.  It does not interfere with sex or daily activities.  It can be used when breastfeeding.  It can be used by women who cannot take estrogen.  The procedure to insert the device is quick.  Women can get pregnant shortly after removing the device.  What are the disadvantages of this form of birth control? The disadvantages of  this form of birth control include the following:  It can cause side effects, including: ? Irregular menstrual periods or bleeding. ?  Headache. ? Weight gain. ? Acne. ? Breast tenderness. ? Abdomen (abdominal) pain. ? Mood changes, such as depression.  It does not protect against STIs.  You must make an office visit to have it inserted and removed by a trained clinician.  Inserting or removing the device can result in pain, scarring, and tissue or nerve damage (rare).  How is this implant inserted? The procedure to insert an implant only takes a few minutes. During the procedure:  Your upper arm will be numbed with a numbing medicine (local anesthetic).  The implant will be injected under the skin of your upper arm with a needle.  After the procedure:  You may experience minor bruising, swelling, or discomfort at the insertion site. This should only last for a couple of days.  You may need to use another, non-hormonal contraceptive such as a condom for 7 days after the procedure.  How is the implant removed? The implant should be removed after 3 years or as directed by your health care provider. The procedure to remove the implant only takes a few minutes. During this procedure:  Your upper arm will be numbed with a local anesthetic.  A small incision will be made near the implant.  The implant will be removed with a small pair of forceps.  After the implant is removed:  The effect of the implant will wear off a few hours after removal. Most women will be able to get pregnant within 3 weeks of removal.  A new implant can be inserted as soon as the old one is removed, if desired.  You may experience minor bruising, swelling, or discomfort at the removal site. This should only last for a couple of days.  Is this implant right for me? Your health care provider can help you determine whether you are good candidate for a contraceptive implant. Make sure to discuss the  possible side effects with your health care provider. You should not get the implant if you:  Are pregnant.  Are allergic to any part of the implant.  Have a history of: ? Breast cancer. ? Unusual bleeding from the vagina. ? Heart disease. ? Stroke. ? Liver disease or tumors. ? Migraines.  Summary  A contraceptive implant is a small, plastic rod that is inserted under the skin. The implant releases a hormone into the bloodstream that prevents pregnancy.  Contraceptive implants can be effective for up to 3 years.  The implant works by preventing ovaries from releasing eggs, thickening the cervical mucus, and thinning the uterine wall.  This form of birth control is very effective at preventing pregnancy and can be inserted and removed quickly. Women can get pregnant shortly after the device is removed.  This form of birth control can cause some side effects, including weight gain, breast tenderness, headaches, irregular periods or bleeding, acne, abdominal pain, and depression. It does not provide protection against STIs (sexually transmitted infections). This information is not intended to replace advice given to you by your health care provider. Make sure you discuss any questions you have with your health care provider. Document Released: 08/15/2011 Document Revised: 08/10/2016 Document Reviewed: 08/10/2016 Elsevier Interactive Patient Education  2017 [employer].

## 2017-03-17 NOTE — Addendum Note (Signed)
Addended by: Dalphine HandingGARDNER, Ryer Asato L on: 03/17/2017 02:23 PM   Modules accepted: Orders

## 2017-03-17 NOTE — Progress Notes (Signed)
Post Partum Exam  Duane BostonShacarrah Leach is a 26 y.o. [id number] female who presents for a postpartum visit. She is 4 week postpartum following a spontaneous vaginal delivery. I have fully reviewed the prenatal and intrapartum course. The delivery was at 39 gestational weeks.  Anesthesia: epidural. Postpartum course has been unremarkable. Baby's course has been unremarkable. Baby is feeding by bottle - Similac Advance. Bleeding no bleeding. Bowel function is normal. Bladder function is normal. Patient is not sexually active. Contraception method is none. Postpartum depression screening:neg  The following portions of the patient's history were reviewed and updated as appropriate: allergies, current medications, past family history, past medical history, past social history and past surgical history.  Review of Systems Pertinent items are noted in HPI.    Objective:  unknown if currently breastfeeding.  General:  alert   Breasts:  not examined  Lungs: clear to auscultation bilaterally  Heart:  regular rate and rhythm, S1, S2 normal, no murmur, click, rub or gallop  Abdomen: soft, non-tender; bowel sounds normal; no masses,  no organomegaly   Vulva:  not evaluated  Vagina: not evaluated  Cervix:  not evaluated  Corpus: not examined  Adnexa:  not evaluated  Rectal Exam: Normal rectovaginal exam        Assessment:    Normal postpartum exam. Pap smear 2/18  Plan:   1. Contraception: Nexplanon 2. Return to normal ADL's 3. Follow up in: 1 year or as needed.

## 2017-03-25 ENCOUNTER — Encounter: Payer: Self-pay | Admitting: *Deleted

## 2017-04-10 ENCOUNTER — Other Ambulatory Visit: Payer: Medicaid Other

## 2017-04-11 ENCOUNTER — Ambulatory Visit: Payer: Medicaid Other | Admitting: Hematology and Oncology

## 2017-04-11 NOTE — Assessment & Plan Note (Deleted)
Profound iron deficiency anemia with pregnancy Patient received IV iron therapy before delivery. The delivery was uneventful at [redacted] weeks gestation.  Lab work review:  Return to clinic in 6 months with recheck of labs and follow-up. If at that time iron studies look good then we do not have to see her again.

## 2017-04-14 ENCOUNTER — Ambulatory Visit: Payer: Medicaid Other | Admitting: Obstetrics and Gynecology

## 2017-04-17 ENCOUNTER — Encounter: Payer: Self-pay | Admitting: Obstetrics and Gynecology

## 2017-04-17 ENCOUNTER — Ambulatory Visit (INDEPENDENT_AMBULATORY_CARE_PROVIDER_SITE_OTHER): Payer: Medicaid Other | Admitting: Obstetrics and Gynecology

## 2017-04-17 DIAGNOSIS — Z3049 Encounter for surveillance of other contraceptives: Secondary | ICD-10-CM | POA: Diagnosis not present

## 2017-04-17 DIAGNOSIS — Z975 Presence of (intrauterine) contraceptive device: Secondary | ICD-10-CM | POA: Insufficient documentation

## 2017-04-17 NOTE — Progress Notes (Signed)
Pt has no complaints about her Nexplanon.   As noted above Pt here for f/u Nexplanon insertion. Doing well. No complaints  A/P F/U Nexplanon

## 2018-03-07 IMAGING — US US OB COMP LESS 14 WK
1 series · 15 of 28 positions shown · non-contrast
Comparison: No prior scans from this gestation.

CLINICAL DATA: 25-year-old pregnant female with 2 weeks of pelvic
pain, right greater than left. Quantitative beta HCG [DATE].

LMP uncertain.
EXAM:
OBSTETRIC <14 WK ULTRASOUND
TECHNIQUE: Transabdominal ultrasound was performed for evaluation of the
gestation as well as the maternal uterus and adnexal regions.

[Series 1: us ob comp less 14 wk · 50 acquisitions, 15 frames shown]
[im 1/50]
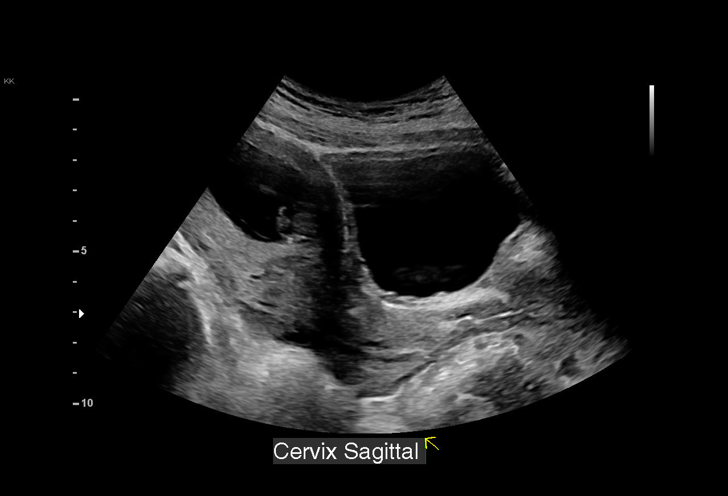
[im 4/50]
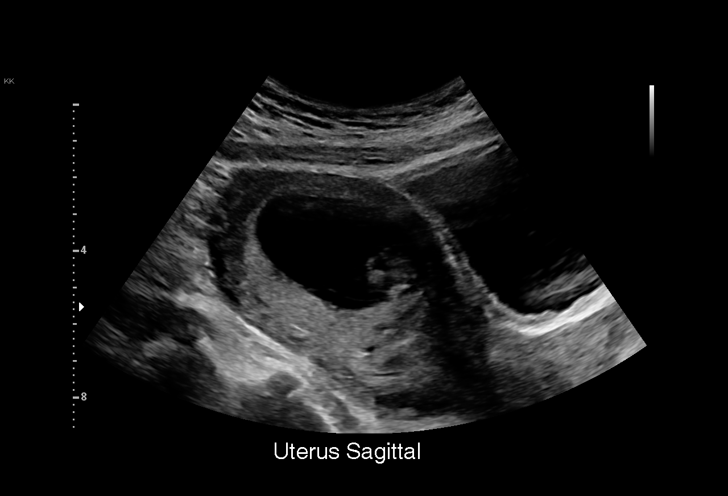
[im 8/50]
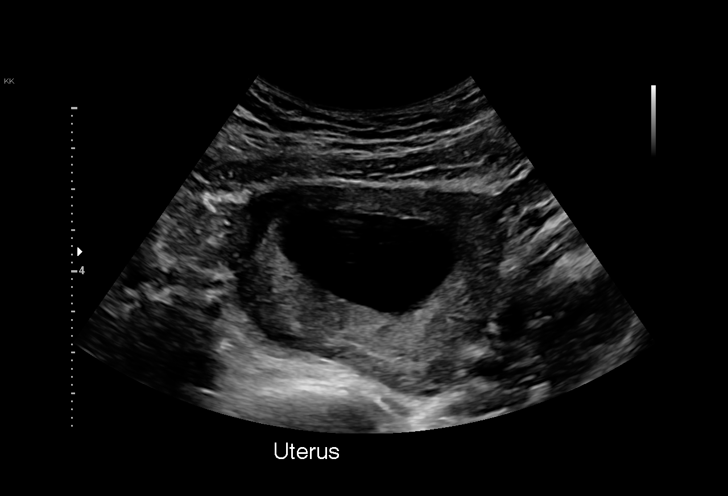
[im 11/50]
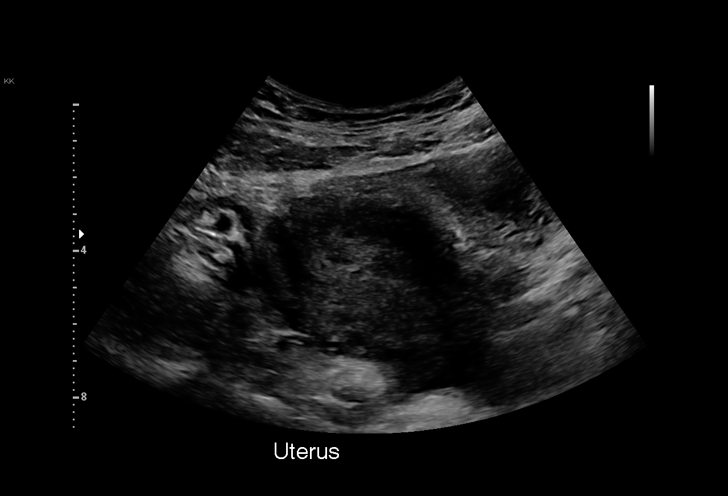
[im 15/50]
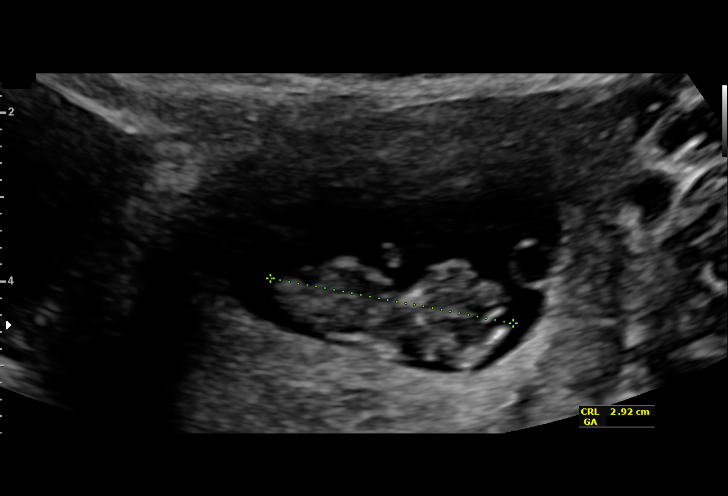
[im 19/50]
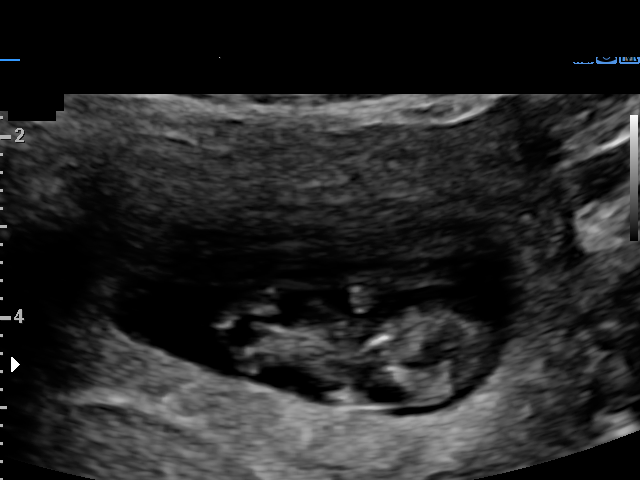
[im 22/50]
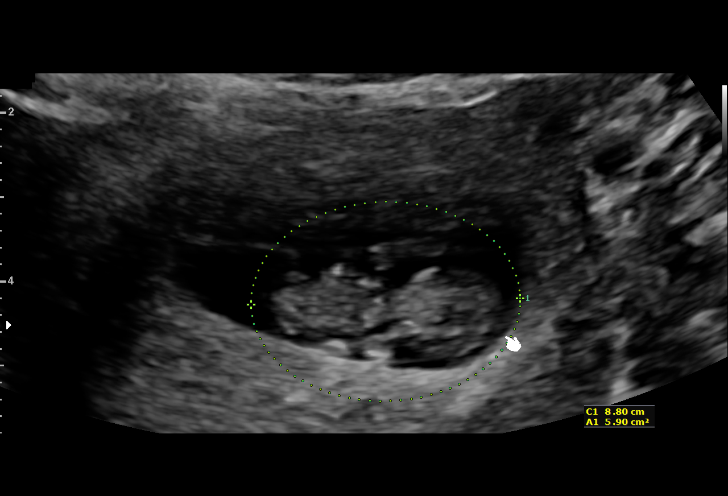
[im 26/50]
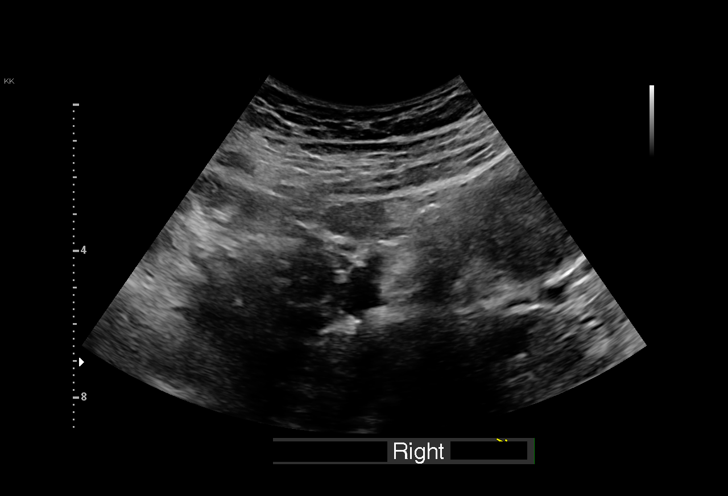
[im 28/50]
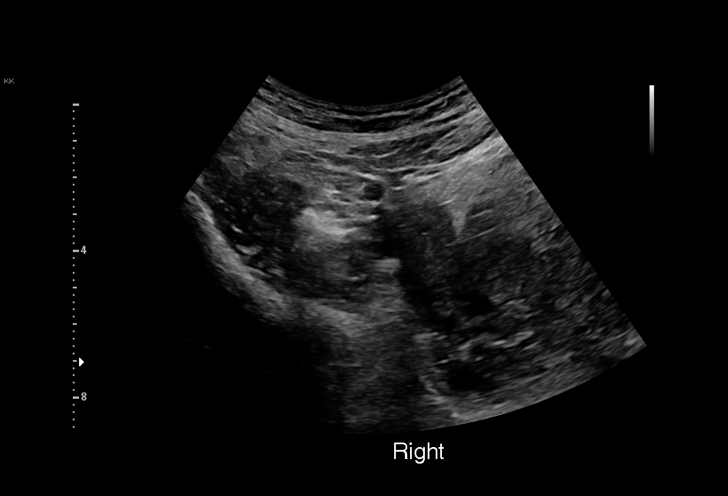
[im 31/50]
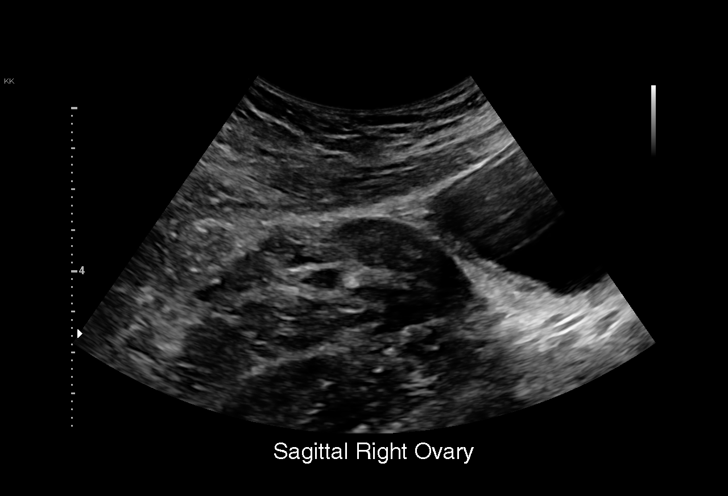
[im 35/50]
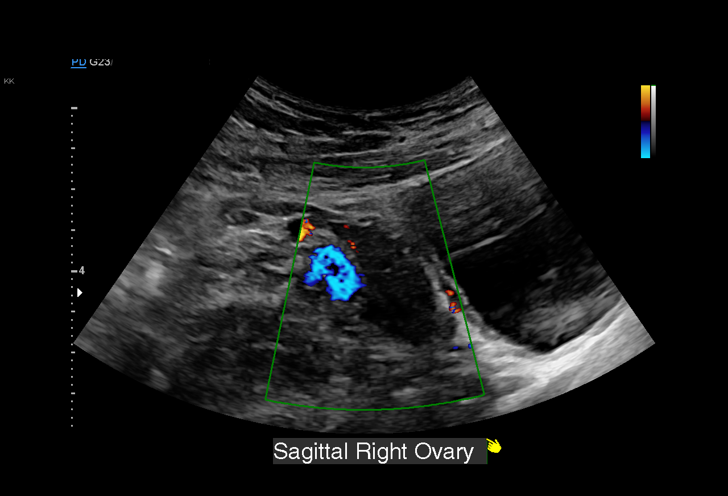
[im 39/50]
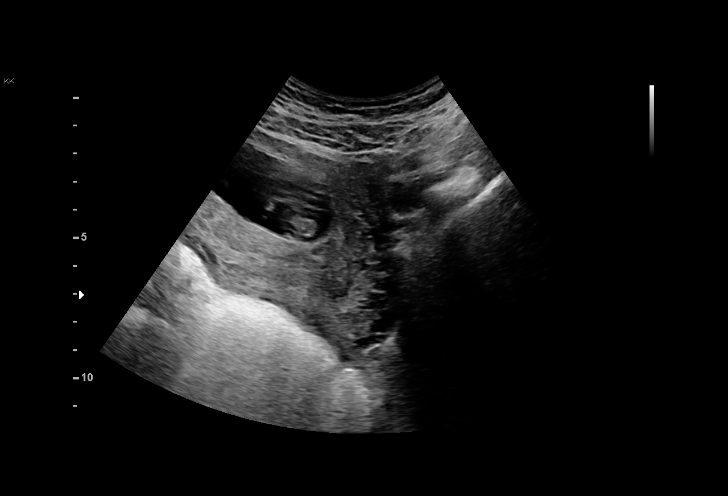
[im 42/50]
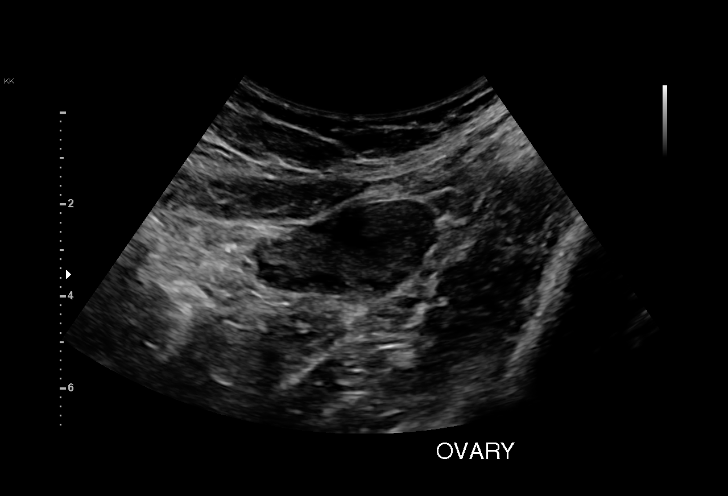
[im 46/50]
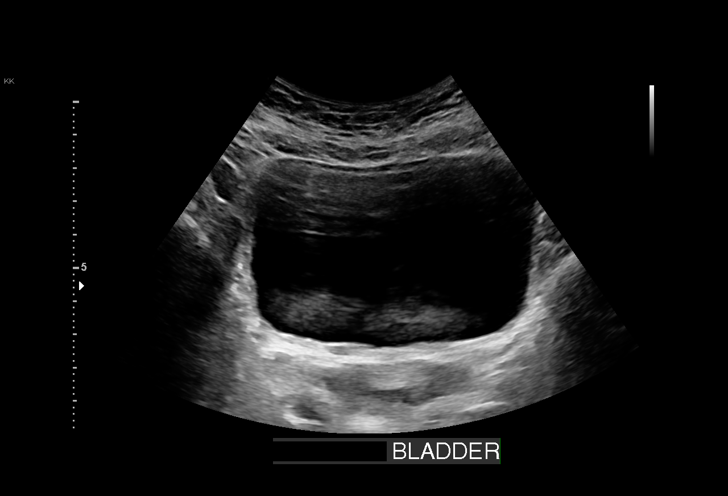
[im 50/50]
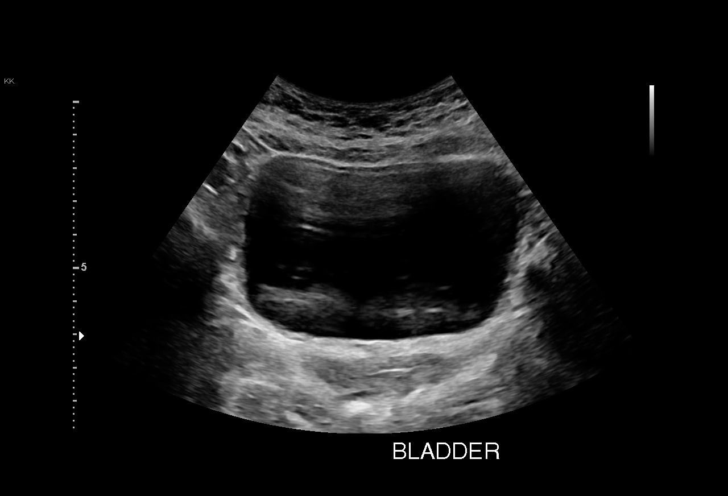

[15 of 28 positions shown; findings below may reference images not displayed]

FINDINGS: Intrauterine gestational sac: Single intrauterine gestational sac
appears normal in size, shape and position.

Yolk sac:  Present.

Fetus:  Present.

Fetal Cardiac Activity: Regular rate and rhythm.

Fetal Heart Rate: 168 bpm

CRL:   31.2  mm   10 w 0 d                  US EDC: 02/18/2017

Subchorionic hemorrhage:  None visualized.

Maternal uterus/adnexae: Right ovary measures 4.0 x 1.3 x 1.3 cm.
Left ovary measures 3.7 x 1.9 x 3.7 cm and contains a corpus luteum.
No suspicious ovarian or adnexal masses. No abnormal free fluid in
the pelvis. No uterine fibroids are demonstrated. Incidental
apparent nonspecific debris layering in the bladder.
IMPRESSION: 1. Single living intrauterine gestation at 10 weeks 0 days by
crown-rump length.
2. No acute first-trimester gestational abnormality.
3. Incidental nonspecific debris layering in the bladder. Consider
correlation with urinalysis to exclude acute cystitis.

## 2019-01-03 ENCOUNTER — Encounter (HOSPITAL_BASED_OUTPATIENT_CLINIC_OR_DEPARTMENT_OTHER): Payer: Self-pay | Admitting: Emergency Medicine

## 2019-01-03 ENCOUNTER — Emergency Department (HOSPITAL_BASED_OUTPATIENT_CLINIC_OR_DEPARTMENT_OTHER)
Admission: EM | Admit: 2019-01-03 | Discharge: 2019-01-03 | Disposition: A | Discharge status: Home / Self Care | Payer: Self-pay | Attending: Emergency Medicine | Admitting: Emergency Medicine

## 2019-01-03 ENCOUNTER — Other Ambulatory Visit: Payer: Self-pay

## 2019-01-03 DIAGNOSIS — K047 Periapical abscess without sinus: Secondary | ICD-10-CM | POA: Insufficient documentation

## 2019-01-03 DIAGNOSIS — Z79899 Other long term (current) drug therapy: Secondary | ICD-10-CM | POA: Insufficient documentation

## 2019-01-03 DIAGNOSIS — K0381 Cracked tooth: Secondary | ICD-10-CM | POA: Insufficient documentation

## 2019-01-03 DIAGNOSIS — K029 Dental caries, unspecified: Secondary | ICD-10-CM | POA: Insufficient documentation

## 2019-01-03 MED ORDER — AMOXICILLIN 500 MG PO CAPS: 500.0000 mg | ORAL_CAPSULE | Freq: Three times a day (TID) | ORAL | 0 refills | Status: DC

## 2019-01-03 NOTE — ED Triage Notes (Signed)
Pt c/o left sided toothache x 2 days. Pt reports pain has radiated to head. Pt reports numbness to bottom left side onset yesterday. No facial droop noted.

## 2019-01-03 NOTE — ED Provider Notes (Signed)
[facility] EMERGENCY DEPARTMENT Provider Note   CSN: [id number] Arrival date & time: 01/03/19  0845    History   Chief Complaint Chief Complaint  Patient presents with  . Dental Pain    HPI Bridget Leach is a 28 y.o. female.     The history is provided by the patient.  Dental Pain  Location:  Lower Lower teeth location:  20/LL 2nd bicuspid Quality:  Aching, constant, localized, sharp and shooting Severity:  Severe Onset quality:  Gradual Duration:  1 week Timing:  Constant Progression:  Worsening Chronicity:  New Context: dental caries and dental fracture   Context comment:  Can't remember when the tooth broke off.  1 week ago started having pain but in the last 2 days pain radiating to the left temple and now having some numbness to the left side of the lip.  has not noticed facial swelling Relieved by:  Nothing Worsened by:  Cold food/drink, hot food/drink and jaw movement Ineffective treatments:  Acetaminophen and NSAIDs Associated symptoms: facial pain and headaches   Associated symptoms: no difficulty swallowing, no drooling, no facial swelling, no fever, no neck pain, no neck swelling and no trismus   Risk factors: lack of dental care and periodontal disease   Risk factors: no alcohol problem, no diabetes and no smoking     Past Medical History:  Diagnosis Date  . Chlamydia 06/24/15  . Herpes genitalia   . Sickle cell trait carrier 12/22/2012   Hgb AS    Patient Active Problem List   Diagnosis Date Noted  . Nexplanon in place 04/17/2017  . Postpartum care and examination 03/17/2017  . Implanon insertion 03/17/2017  . Iron deficiency anemia 01/09/2017  . Low vitamin D level 10/21/2016  . 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 Genital herpes simplex 10/12/2015  . Cannabis abuse 10/12/2015  . Sickle cell trait carrier 12/22/2012    Past Surgical History:  Procedure Laterality Date  . DENTAL SURGERY    . DILATION AND CURETTAGE OF  UTERUS       OB History    Gravida  3   Para  2   Term  1   Preterm  1   AB  1   Living  2     SAB  0   TAB  1   Ectopic  0   Multiple  0   Live Births  2            Home Medications    Prior to Admission medications   Medication Sig Start Date End Date Taking? Authorizing Provider  amoxicillin (AMOXIL) 500 MG capsule Take 1 capsule (500 mg total) by mouth 3 (three) times daily. 01/03/19   Gwyneth Sprout, MD  etonogestrel (NEXPLANON) 68 MG IMPL implant 1 each by Subdermal route once.    [provider]    Family History Family History  Problem Relation Age of Onset  . Diabetes Maternal Grandmother   . Hypertension Maternal Grandmother   . Cancer Maternal Grandmother        leukemia  . Diabetes Maternal Grandfather   . Hypertension Maternal Grandfather     Social History Social History   Tobacco Use  . Smoking status: Never Smoker  . Smokeless tobacco: Never Used  Substance Use Topics  . Alcohol use: No  . Drug use: No    Frequency: 1.0 times per week    Types: Marijuana     Allergies   Patient has  no known allergies.   Review of Systems Review of Systems  Constitutional: Negative for fever.  HENT: Negative for drooling and facial swelling.   Musculoskeletal: Negative for neck pain.  Neurological: Positive for headaches.  All other systems reviewed and are negative.    Physical Exam Updated Vital Signs BP (!) 139/103 (BP Location: Right Arm)   Pulse (!) 115   Temp 98.6 F (37 C) (Oral)   Resp (!) 24   Ht 5\' 6"  (1.676 m)   Wt 81.6 kg   LMP  (LMP Unknown)   SpO2 100%   Breastfeeding No   BMI 29.05 kg/m   Physical Exam Vitals signs and nursing note reviewed.  Constitutional:      General: She is not in acute distress.    Appearance: She is normal weight.  HENT:     Head: Normocephalic.     Nose: Nose normal.     Mouth/Throat:     Mouth: Mucous membranes are moist.   Neck:     Musculoskeletal: Normal  range of motion and neck supple. No neck rigidity.     Comments: No anterior neck pain or swelling.  No trismus Cardiovascular:     Rate and Rhythm: Tachycardia present.  Pulmonary:     Effort: Pulmonary effort is normal.  Skin:    General: Skin is warm.  Neurological:     General: No focal deficit present.     Mental Status: She is alert. Mental status is at baseline.  Psychiatric:        Mood and Affect: Mood normal.        Behavior: Behavior normal.        Thought Content: Thought content normal.      ED Treatments / Results  Labs (all labs ordered are listed, but only abnormal results are displayed) Labs Reviewed - No data to display  EKG None  Radiology No results found.  Procedures Procedures (including critical care time)  Medications Ordered in ED Medications - No data to display   Initial Impression / Assessment and Plan / ED Course  I have reviewed the triage vital signs and the nursing notes.  Pertinent labs & imaging results that were available during my care of the patient were reviewed by me and considered in my medical decision making (see chart for details).        Pt with dental caries and no facial swelling but some parasthesias over the left side of lip which is mostly likely from nerve involvement related to dental abscess.  No signs of ludwig's angina or difficulty swallowing and no systemic symptoms. Will treat with amoxicillin and have pt f/u with dentist.  Given return precautions as dentist are not currently available.   Final Clinical Impressions(s) / ED Diagnoses   Final diagnoses:  Dental abscess    ED Discharge Orders         Ordered    amoxicillin (AMOXIL) 500 MG capsule  3 times daily     01/03/19 [id number]           Gwyneth SproutPlunkett, Marvene Strohm, MD 01/03/19 [id number]

## 2019-01-03 NOTE — Discharge Instructions (Signed)
If you develop worsening pain, facial swelling, difficulty swallowing or neck pain within the next 2 days to 1 week you should return to the emergency room for repeat evaluation.  Take ibuprofen 400 mg and extra strength Tylenol 1000 mg every 6 hours for pain control.  I would expect within 1 to 2 days the pain should be improving

## 2019-01-21 ENCOUNTER — Encounter (HOSPITAL_COMMUNITY): Payer: Self-pay

## 2019-01-21 ENCOUNTER — Other Ambulatory Visit: Payer: Self-pay

## 2019-01-21 ENCOUNTER — Ambulatory Visit (HOSPITAL_COMMUNITY)
Admission: EM | Admit: 2019-01-21 | Discharge: 2019-01-21 | Disposition: A | Discharge status: Home / Self Care | Payer: Self-pay | Attending: Family Medicine | Admitting: Family Medicine

## 2019-01-21 DIAGNOSIS — K0889 Other specified disorders of teeth and supporting structures: Secondary | ICD-10-CM

## 2019-01-21 MED ORDER — CLINDAMYCIN HCL 300 MG PO CAPS: 300.0000 mg | ORAL_CAPSULE | Freq: Three times a day (TID) | ORAL | 0 refills | Status: DC

## 2019-01-21 MED ORDER — TRAMADOL HCL 50 MG PO TABS: 50.0000 mg | ORAL_TABLET | Freq: Four times a day (QID) | ORAL | 0 refills | Status: DC | PRN

## 2019-01-21 NOTE — ED Triage Notes (Signed)
Pt C/o  Left side toothache, symptoms started about 3 weeks. Pt has completed the antibiotic she started back on April 26, 20.

## 2019-01-21 NOTE — ED Provider Notes (Signed)
[facility]   [id number] 01/21/19 Arrival Time: 0908  ASSESSMENT & PLAN:  1. Pain, dental    No sign of abscess requiring I&D at this time. Discussed.  Meds ordered this encounter  Medications  . clindamycin (CLEOCIN) 300 MG capsule    Sig: Take 1 capsule (300 mg total) by mouth 3 (three) times daily.    Dispense:  30 capsule    Refill:  0  . traMADol (ULTRAM) 50 MG tablet    Sig: Take 1 tablet (50 mg total) by mouth every 6 (six) hours as needed.    Dispense:  15 tablet    Refill:  0   [place] Controlled Substances Registry consulted for this patient. I feel the risk/benefit ratio today is favorable for proceeding with this prescription for a controlled substance. Medication sedation precautions given.  Dental resource written instructions given. She will schedule dental evaluation as soon as possible if not improving over the next 24-48 hours.  Reviewed expectations re: course of current medical issues. Questions answered. Outlined signs and symptoms indicating need for more acute intervention. Patient verbalized understanding. After Visit Summary given.   SUBJECTIVE:  Bridget Leach is a 28 y.o. female who reports gradual onset of right upper dental pain described as aching. Treated for the same a few weeks ago; amoxicillin; "mostly went away but now it's coming back". Present for several days. Fever: absent. Tolerating PO intake but reports pain with chewing. Normal swallowing. She does not see a dentist regularly. No neck swelling or pain. OTC analgesics without relief.  ROS: As per HPI. All other systems negative.   OBJECTIVE: Vitals:   01/21/19 1000  BP: 120/80  Pulse: 73  Resp: 16  Temp: 98.4 F (36.9 C)  TempSrc: Oral  SpO2: 99%    General appearance: alert; no distress HEENT: normocephalic; atraumatic; dentition: fair; left upper gums without areas of fluctuance, drainage, or bleeding and with tenderness to palpation; normal jaw movement without  difficulty Neck: supple without LAD; FROM; trachea midline CV: RRR Lungs: normal respirations; unlabored Skin: warm and dry Psychological: alert and cooperative; normal mood and affect  No Known Allergies  Past Medical History:  Diagnosis Date  . Chlamydia 06/24/15  . Herpes genitalia   . Sickle cell trait carrier 12/22/2012   Hgb AS   Social History   Socioeconomic History  . Marital status: Single    Spouse name: Not on file  . Number of children: Not on file  . Years of education: Not on file  . Highest education level: Not on file  Occupational History  . Not on file  Social Needs  . Financial resource strain: Not on file  . Food insecurity:    Worry: Not on file    Inability: Not on file  . Transportation needs:    Medical: Not on file    Non-medical: Not on file  Tobacco Use  . Smoking status: Never Smoker  . Smokeless tobacco: Never Used  Substance and Sexual Activity  . Alcohol use: No  . Drug use: No    Frequency: 1.0 times per week    Types: Marijuana  . Sexual activity: Yes    Birth control/protection: Condom  Lifestyle  . Physical activity:    Days per week: Not on file    Minutes per session: Not on file  . Stress: Not on file  Relationships  . Social connections:    Talks on phone: Not on file    Gets together: Not on  file    Attends religious service: Not on file    Active member of club or organization: Not on file    Attends meetings of clubs or organizations: Not on file    Relationship status: Not on file  . Intimate partner violence:    Fear of current or ex partner: Not on file    Emotionally abused: Not on file    Physically abused: Not on file    Forced sexual activity: Not on file  Other Topics Concern  . Not on file  Social History Narrative  . Not on file   Family History  Problem Relation Age of Onset  . Diabetes Maternal Grandmother   . Hypertension Maternal Grandmother   . Cancer Maternal Grandmother        leukemia   . Diabetes Maternal Grandfather   . Hypertension Maternal Grandfather    Past Surgical History:  Procedure Laterality Date  . DENTAL SURGERY    . DILATION AND CURETTAGE OF UTERUS       Mardella LaymanHagler, Lisel Siegrist, MD 01/27/19 [phone]

## 2019-01-21 NOTE — Discharge Instructions (Signed)
Be aware, pain medications may cause drowsiness. Please do not drive, operate heavy machinery or make important decisions while on this medication, it can cloud your judgement.  

## 2019-03-08 ENCOUNTER — Emergency Department (HOSPITAL_BASED_OUTPATIENT_CLINIC_OR_DEPARTMENT_OTHER)
Admission: EM | Admit: 2019-03-08 | Discharge: 2019-03-08 | Disposition: A | Discharge status: Home / Self Care | Payer: Self-pay | Attending: Emergency Medicine | Admitting: Emergency Medicine

## 2019-03-08 ENCOUNTER — Encounter (HOSPITAL_BASED_OUTPATIENT_CLINIC_OR_DEPARTMENT_OTHER): Payer: Self-pay | Admitting: *Deleted

## 2019-03-08 ENCOUNTER — Other Ambulatory Visit: Payer: Self-pay

## 2019-03-08 DIAGNOSIS — K047 Periapical abscess without sinus: Secondary | ICD-10-CM | POA: Insufficient documentation

## 2019-03-08 DIAGNOSIS — Z79899 Other long term (current) drug therapy: Secondary | ICD-10-CM | POA: Insufficient documentation

## 2019-03-08 MED ORDER — IBUPROFEN 800 MG PO TABS: 800.0000 mg | ORAL_TABLET | Freq: Three times a day (TID) | ORAL | 0 refills | Status: DC | PRN

## 2019-03-08 MED ORDER — TRAMADOL HCL 50 MG PO TABS: 50.0000 mg | ORAL_TABLET | Freq: Four times a day (QID) | ORAL | 0 refills | Status: DC | PRN

## 2019-03-08 MED ORDER — PENICILLIN V POTASSIUM 500 MG PO TABS: 500.0000 mg | ORAL_TABLET | Freq: Four times a day (QID) | ORAL | 0 refills | Status: DC

## 2019-03-08 NOTE — Discharge Instructions (Addendum)
Follow-up with the dentist provided.  Rinse with warm water and peroxide 3 times a day.  Heat and warm compresses over the area.

## 2019-03-08 NOTE — ED Provider Notes (Signed)
[facility] Provider Note   CSN: [id number] Arrival date & time: 03/08/19  1731     History   Chief Complaint Chief Complaint  Patient presents with  . Abscess    HPI Bridget Leach is a 28 y.o. female.     HPI Patient presents to the emergency department with dental abscess is been in place over the last few weeks.  The patient states she was seen at urgent care and given clindamycin but she was unable to tolerate it.  Patient states she took about 2 days worth and that was it.  She states that she has not tried any other treatments.  Patient states that nothing seems make the condition better but palpation makes the pain worse.  The patient denies chest pain, shortness of breath, headache,blurred vision, neck pain, fever, cough, weakness, numbness, dizziness, anorexia, edema, abdominal pain, nausea, vomiting, diarrhea, near syncope, or syncope. Past Medical History:  Diagnosis Date  . Chlamydia 06/24/15  . Herpes genitalia   . Sickle cell trait carrier 12/22/2012   Hgb AS    Patient Active Problem List   Diagnosis Date Noted  . Nexplanon in place 04/17/2017  . Postpartum care and examination 03/17/2017  . Implanon insertion 03/17/2017  . Iron deficiency anemia 01/09/2017  . Low vitamin D level 10/21/2016  . 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 Genital herpes simplex 10/12/2015  . Cannabis abuse 10/12/2015  . Sickle cell trait carrier 12/22/2012    Past Surgical History:  Procedure Laterality Date  . DENTAL SURGERY    . DILATION AND CURETTAGE OF UTERUS       OB History    Gravida  3   Para  2   Term  1   Preterm  1   AB  1   Living  2     SAB  0   TAB  1   Ectopic  0   Multiple  0   Live Births  2            Home Medications    Prior to Admission medications   Medication Sig Start Date End Date Taking? Authorizing Provider  etonogestrel (NEXPLANON) 68 MG IMPL implant 1 each by Subdermal route  once.   Yes [provider]  amoxicillin (AMOXIL) 500 MG capsule Take 1 capsule (500 mg total) by mouth 3 (three) times daily. 01/03/19   Blanchie Dessert, MD  clindamycin (CLEOCIN) 300 MG capsule Take 1 capsule (300 mg total) by mouth 3 (three) times daily. 01/21/19   Vanessa Kick, MD  traMADol (ULTRAM) 50 MG tablet Take 1 tablet (50 mg total) by mouth every 6 (six) hours as needed. 01/21/19   Vanessa Kick, MD    Family History Family History  Problem Relation Age of Onset  . Diabetes Maternal Grandmother   . Hypertension Maternal Grandmother   . Cancer Maternal Grandmother        leukemia  . Diabetes Maternal Grandfather   . Hypertension Maternal Grandfather     Social History Social History   Tobacco Use  . Smoking status: Never Smoker  . Smokeless tobacco: Never Used  Substance Use Topics  . Alcohol use: No  . Drug use: No    Frequency: 1.0 times per week    Types: Marijuana     Allergies   Clindamycin/lincomycin   Review of Systems Review of Systems All other systems negative except as documented in the HPI. All pertinent positives and negatives  as reviewed in the HPI.  Physical Exam Updated Vital Signs BP 129/90   Pulse 87   Temp 98.7 F (37.1 C) (Oral)   Resp 18   Ht 5\' 6"  (1.676 m)   Wt 69.8 kg   LMP 02/08/2019   SpO2 100%   BMI 24.82 kg/m   Physical Exam Vitals signs and nursing note reviewed.  Constitutional:      General: She is not in acute distress.    Appearance: She is well-developed.  HENT:     Head: Normocephalic and atraumatic.     Mouth/Throat:     Dentition: Dental tenderness present.   Eyes:     Pupils: Pupils are equal, round, and reactive to light.  Pulmonary:     Effort: Pulmonary effort is normal.  Skin:    General: Skin is warm and dry.  Neurological:     Mental Status: She is alert and oriented to person, place, and time.      ED Treatments / Results  Labs (all labs ordered are listed, but only abnormal  results are displayed) Labs Reviewed - No data to display  EKG    Radiology No results found.  Procedures Procedures (including critical care time)  Medications Ordered in ED Medications - No data to display   Initial Impression / Assessment and Plan / ED Course  I have reviewed the triage vital signs and the nursing notes.  Pertinent labs & imaging results that were available during my care of the patient were reviewed by me and considered in my medical decision making (see chart for details).       She will be given dental referral and advised to use warm compresses and heat over the area along with warm water peroxide rinses.  Advised patient to return here as needed told to follow-up as soon as possible with the dentist.    Final Clinical Impressions(s) / ED Diagnoses   Final diagnoses:  None    ED Discharge Orders    None       Charlestine NightLawyer, Laterria Lasota, Cordelia Poche-C 03/08/19 1806    Gwyneth SproutPlunkett, Whitney, MD 03/08/19 2120

## 2019-03-08 NOTE — ED Triage Notes (Signed)
Dental pain. Swelling to the left side of her face since last night.

## 2020-03-22 ENCOUNTER — Ambulatory Visit
Admission: EM | Admit: 2020-03-22 | Discharge: 2020-03-22 | Disposition: A | Discharge status: Home / Self Care | Payer: Medicaid Other | Attending: Physician Assistant | Admitting: Physician Assistant

## 2020-03-22 DIAGNOSIS — M79602 Pain in left arm: Secondary | ICD-10-CM

## 2020-03-22 MED ORDER — PREDNISONE 50 MG PO TABS: 50.0000 mg | ORAL_TABLET | Freq: Every day | ORAL | 0 refills | Status: DC

## 2020-03-22 NOTE — ED Provider Notes (Signed)
[name]    CSN: [id number] Arrival date & time: 03/22/20  1924      History   Chief Complaint Chief Complaint  Patient presents with  . Shoulder Pain    HPI Bridget Leach is a 29 y.o. female.   29 year old female comes in for few hour history of left shoulder discomfort that can radiate down to the fingertips.  Denies injury/trauma.  Denies swelling, erythema, warmth, lesions.  States felt a knot to the mid upper left arm, but this has resolved.  She does notes that she occasionally has numbness, tingling to the left lower arm, but this usually resolves on its own, and usually due to certain positions.  Denies loss of grip strength.  Work requires repetitive motion, but without heavy lifting or strenuous activity.     Past Medical History:  Diagnosis Date  . Chlamydia 06/24/15  . Herpes genitalia   . Sickle cell trait carrier 12/22/2012   Hgb AS    Patient Active Problem List   Diagnosis Date Noted  . Nexplanon in place 04/17/2017  . Postpartum care and examination 03/17/2017  . Implanon insertion 03/17/2017  . Iron deficiency anemia 01/09/2017  . Low vitamin D level 10/21/2016  . 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 Genital herpes simplex 10/12/2015  . Cannabis abuse 10/12/2015  . Sickle cell trait carrier 12/22/2012    Past Surgical History:  Procedure Laterality Date  . DENTAL SURGERY    . DILATION AND CURETTAGE OF UTERUS      OB History    Gravida  3   Para  2   Term  1   Preterm  1   AB  1   Living  2     SAB  0   TAB  1   Ectopic  0   Multiple  0   Live Births  2            Home Medications    Prior to Admission medications   Medication Sig Start Date End Date Taking? Authorizing Provider  predniSONE (DELTASONE) 50 MG tablet Take 1 tablet (50 mg total) by mouth daily with breakfast. 03/22/20   Belinda Fisher, PA-C    Family History Family History  Problem Relation Age of Onset  . Diabetes Maternal  Grandmother   . Hypertension Maternal Grandmother   . Cancer Maternal Grandmother        leukemia  . Diabetes Maternal Grandfather   . Hypertension Maternal Grandfather     Social History Social History   Tobacco Use  . Smoking status: Never Smoker  . Smokeless tobacco: Never Used  Vaping Use  . Vaping Use: Never used  Substance Use Topics  . Alcohol use: No  . Drug use: No    Frequency: 1.0 times per week    Types: Marijuana     Allergies   Clindamycin/lincomycin   Review of Systems Review of Systems  Reason unable to perform ROS: See HPI as above.     Physical Exam Triage Vital Signs ED Triage Vitals  Enc Vitals Group     BP 03/22/20 1941 (!) 145/102     Pulse Rate 03/22/20 1941 98     Resp 03/22/20 1941 18     Temp 03/22/20 1941 98.1 F (36.7 C)     Temp Source 03/22/20 1941 Oral     SpO2 03/22/20 1941 99 %     Weight --      Height --  Head Circumference --      Peak Flow --      Pain Score 03/22/20 1952 0     Pain Loc --      Pain Edu? --      Excl. in GC? --    No data found.  Updated Vital Signs BP (!) 145/102 (BP Location: Left Arm)   Pulse 98   Temp 98.1 F (36.7 C) (Oral)   Resp 18   LMP 03/02/2020   SpO2 99%   Physical Exam Constitutional:      General: She is not in acute distress.    Appearance: She is well-developed. She is not diaphoretic.  HENT:     Head: Normocephalic and atraumatic.  Eyes:     Conjunctiva/sclera: Conjunctivae normal.     Pupils: Pupils are equal, round, and reactive to light.  Cardiovascular:     Rate and Rhythm: Normal rate and regular rhythm.     Heart sounds: Normal heart sounds. No murmur heard.  No friction rub. No gallop.   Pulmonary:     Effort: Pulmonary effort is normal. No accessory muscle usage or respiratory distress.     Breath sounds: Normal breath sounds. No stridor. No decreased breath sounds, wheezing, rhonchi or rales.  Musculoskeletal:     Comments: No tenderness on palpation of  the spinous processes.  No tenderness to palpation of the neck, thoracic back, BUE. Full range of motion of BUE. Strength normal and equal bilaterally. Sensation intact and equal bilaterally. Radial pulses 2+ and equal bilaterally. Capillary refill less than 2 seconds.   Skin:    General: Skin is warm and dry.  Neurological:     Mental Status: She is alert and oriented to person, place, and time.      UC Treatments / Results  Labs (all labs ordered are listed, but only abnormal results are displayed) Labs Reviewed - No data to display  EKG   Radiology No results found.  Procedures Procedures (including critical care time)  Medications Ordered in UC Medications - No data to display  Initial Impression / Assessment and Plan / UC Course  I have reviewed the triage vital signs and the nursing notes.  Pertinent labs & imaging results that were available during my care of the patient were reviewed by me and considered in my medical decision making (see chart for details).    No alarming signs on exam. ?  Radiculopathy given history of numbness/tingling to the area and repetitive motion.  Will trial prednisone as directed.  Return precautions given.  Patient expresses understanding and agrees to plan.  Final Clinical Impressions(s) / UC Diagnoses   Final diagnoses:  Left arm pain   ED Prescriptions    Medication Sig Dispense Auth. Provider   predniSONE (DELTASONE) 50 MG tablet Take 1 tablet (50 mg total) by mouth daily with breakfast. 5 tablet Belinda Fisher, PA-C     PDMP not reviewed this encounter.   Belinda Fisher, PA-C 03/23/20 [phone]

## 2020-03-22 NOTE — ED Triage Notes (Signed)
Pt c/o lt shoulder discomfort radiating to finger tips since 1630 today. Denies injury. States felt a knot to mid upper left arm.

## 2020-03-22 NOTE — Discharge Instructions (Signed)
No alarming signs on exam. Start prednisone for possible nerve pain. Follow up with PCP/sports medicine if symptoms not improving.

## 2020-04-27 ENCOUNTER — Encounter: Payer: Self-pay | Admitting: Emergency Medicine

## 2020-04-27 ENCOUNTER — Ambulatory Visit
Admission: EM | Admit: 2020-04-27 | Discharge: 2020-04-27 | Disposition: A | Discharge status: Home / Self Care | Payer: Medicaid Other | Attending: Emergency Medicine | Admitting: Emergency Medicine

## 2020-04-27 ENCOUNTER — Other Ambulatory Visit: Payer: Self-pay

## 2020-04-27 DIAGNOSIS — Z20822 Contact with and (suspected) exposure to covid-19: Secondary | ICD-10-CM

## 2020-04-27 NOTE — Discharge Instructions (Signed)

## 2020-04-27 NOTE — ED Triage Notes (Addendum)
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

## 2020-04-28 LAB — NOVEL CORONAVIRUS, NAA: SARS-CoV-2, NAA: NOT DETECTED

## 2020-04-28 LAB — SARS-COV-2, NAA 2 DAY TAT

## 2020-06-29 ENCOUNTER — Other Ambulatory Visit: Payer: Self-pay

## 2020-06-29 MED ORDER — VALACYCLOVIR HCL 500 MG PO TABS: 500.0000 mg | ORAL_TABLET | Freq: Two times a day (BID) | ORAL | 1 refills | Status: DC

## 2020-06-29 NOTE — Telephone Encounter (Signed)
Patient would like a refill on her valtrex. She has schedule appointment for 11/22

## 2020-07-31 ENCOUNTER — Ambulatory Visit: Payer: Medicaid Other | Admitting: Obstetrics

## 2020-08-13 ENCOUNTER — Encounter (HOSPITAL_COMMUNITY): Payer: Self-pay | Admitting: Emergency Medicine

## 2020-08-13 ENCOUNTER — Ambulatory Visit (HOSPITAL_COMMUNITY)
Admission: EM | Admit: 2020-08-13 | Discharge: 2020-08-13 | Disposition: A | Discharge status: Home / Self Care | Payer: HRSA Program | Attending: Emergency Medicine | Admitting: Emergency Medicine

## 2020-08-13 ENCOUNTER — Other Ambulatory Visit: Payer: Self-pay

## 2020-08-13 DIAGNOSIS — B349 Viral infection, unspecified: Secondary | ICD-10-CM

## 2020-08-13 DIAGNOSIS — R197 Diarrhea, unspecified: Secondary | ICD-10-CM | POA: Insufficient documentation

## 2020-08-13 DIAGNOSIS — R52 Pain, unspecified: Secondary | ICD-10-CM | POA: Insufficient documentation

## 2020-08-13 DIAGNOSIS — U071 COVID-19: Secondary | ICD-10-CM | POA: Diagnosis not present

## 2020-08-13 NOTE — Discharge Instructions (Addendum)
Your COVID and Flu tests are pending.  You should self quarantine until the test results are back.    Take Tylenol or ibuprofen as needed for fever or discomfort.  Rest and keep yourself hydrated.    Follow-up with your primary care provider if your symptoms are not improving.     

## 2020-08-13 NOTE — ED Triage Notes (Signed)
Pt presents with loss of taste and smell and body aches xs 8-10 days. States started with body aches and recently within the past 3 days has loss taste and smell.

## 2020-08-13 NOTE — ED Provider Notes (Signed)
[facility]    CSN: [id number] Arrival date & time: 08/13/20  1637      History   Chief Complaint Chief Complaint  Patient presents with  . Generalized Body Aches    HPI Bridget Leach is a 29 y.o. female.   Patient presents with a 10-day history of body aches and cough.  She states her cough resolved yesterday but she continues to have body aches and fatigue.  She reports she lost her sense of taste and smell 3 days ago.  She has also had diarrhea for the past 2 days.  She denies fever, chills, rash, sore throat, shortness of breath, vomiting, or other symptoms.  No treatments attempted at home.  The history is provided by the patient.    Past Medical History:  Diagnosis Date  . Chlamydia 06/24/15  . Herpes genitalia   . Sickle cell trait carrier 12/22/2012   Hgb AS    Patient Active Problem List   Diagnosis Date Noted  . Nexplanon in place 04/17/2017  . Postpartum care and examination 03/17/2017  . Implanon insertion 03/17/2017  . Iron deficiency anemia 01/09/2017  . Low vitamin D level 10/21/2016  . 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 Genital herpes simplex 10/12/2015  . Cannabis abuse 10/12/2015  . Sickle cell trait carrier 12/22/2012    Past Surgical History:  Procedure Laterality Date  . DENTAL SURGERY    . DILATION AND CURETTAGE OF UTERUS      OB History    Gravida  3   Para  2   Term  1   Preterm  1   AB  1   Living  2     SAB  0   TAB  1   Ectopic  0   Multiple  0   Live Births  2            Home Medications    Prior to Admission medications   Medication Sig Start Date End Date Taking? Authorizing Provider  predniSONE (DELTASONE) 50 MG tablet Take 1 tablet (50 mg total) by mouth daily with breakfast. 03/22/20   Cathie Hoops, Amy V, PA-C  valACYclovir (VALTREX) 1000 MG tablet valacyclovir 1 gram tablet  TK 1 T PO BID FOR 10 DAYS    [provider]  valACYclovir (VALTREX) 500 MG tablet Take 1 tablet (500  mg total) by mouth 2 (two) times daily. 06/29/20   Brock Bad, MD    Family History Family History  Problem Relation Age of Onset  . Diabetes Maternal Grandmother   . Hypertension Maternal Grandmother   . Cancer Maternal Grandmother        leukemia  . Diabetes Maternal Grandfather   . Hypertension Maternal Grandfather     Social History Social History   Tobacco Use  . Smoking status: Never Smoker  . Smokeless tobacco: Never Used  Vaping Use  . Vaping Use: Never used  Substance Use Topics  . Alcohol use: No  . Drug use: No    Frequency: 1.0 times per week    Types: Marijuana     Allergies   Clindamycin/lincomycin   Review of Systems Review of Systems  Constitutional: Positive for fatigue. Negative for chills and fever.  HENT: Negative for ear pain and sore throat.   Eyes: Negative for pain and visual disturbance.  Respiratory: Positive for cough. Negative for shortness of breath.   Cardiovascular: Negative for chest pain and palpitations.  Gastrointestinal: Positive for diarrhea.  Negative for abdominal pain and vomiting.  Genitourinary: Negative for dysuria and hematuria.  Musculoskeletal: Negative for arthralgias and back pain.  Skin: Negative for color change and rash.  Neurological: Negative for seizures and syncope.  All other systems reviewed and are negative.    Physical Exam Triage Vital Signs ED Triage Vitals  Enc Vitals Group     BP 08/13/20 1751 (!) 134/93     Pulse Rate 08/13/20 1751 70     Resp 08/13/20 1751 16     Temp 08/13/20 1751 99.1 F (37.3 C)     Temp Source 08/13/20 1751 Oral     SpO2 08/13/20 1751 100 %     Weight 08/13/20 1749 190 lb (86.2 kg)     Height 08/13/20 1749 5\' 6"  (1.676 m)     Head Circumference --      Peak Flow --      Pain Score 08/13/20 1749 7     Pain Loc --      Pain Edu? --      Excl. in GC? --    No data found.  Updated Vital Signs BP (!) 134/93 (BP Location: Left Arm)   Pulse 70   Temp 99.1 F  (37.3 C) (Oral)   Resp 16   Ht 5\' 6"  (1.676 m)   Wt 190 lb (86.2 kg)   LMP 08/02/2020   SpO2 100%   BMI 30.67 kg/m   Visual Acuity Right Eye Distance:   Left Eye Distance:   Bilateral Distance:    Right Eye Near:   Left Eye Near:    Bilateral Near:     Physical Exam Vitals and nursing note reviewed.  Constitutional:      General: She is not in acute distress.    Appearance: She is well-developed. She is not ill-appearing.  HENT:     Head: Normocephalic and atraumatic.     Right Ear: Tympanic membrane normal.     Left Ear: Tympanic membrane normal.     Nose: Nose normal.     Mouth/Throat:     Mouth: Mucous membranes are moist.     Pharynx: Oropharynx is clear.  Eyes:     Conjunctiva/sclera: Conjunctivae normal.  Cardiovascular:     Rate and Rhythm: Normal rate and regular rhythm.     Heart sounds: Normal heart sounds.  Pulmonary:     Effort: Pulmonary effort is normal. No respiratory distress.     Breath sounds: Normal breath sounds. No wheezing or rhonchi.  Abdominal:     Palpations: Abdomen is soft.     Tenderness: There is no abdominal tenderness. There is no guarding or rebound.  Musculoskeletal:     Cervical back: Neck supple.  Skin:    General: Skin is warm and dry.     Findings: No rash.  Neurological:     General: No focal deficit present.     Mental Status: She is alert and oriented to person, place, and time.     Gait: Gait normal.  Psychiatric:        Mood and Affect: Mood normal.        Behavior: Behavior normal.      UC Treatments / Results  Labs (all labs ordered are listed, but only abnormal results are displayed) Labs Reviewed - No data to display  EKG   Radiology No results found.  Procedures Procedures (including critical care time)  Medications Ordered in UC Medications - No data to display  Initial Impression /  Assessment and Plan / UC Course  I have reviewed the triage vital signs and the nursing notes.  Pertinent labs  & imaging results that were available during my care of the patient were reviewed by me and considered in my medical decision making (see chart for details).   Viral illness.  Influenza and COVID pending.  Instructed patient to self quarantine until the test results are back.  Discussed symptomatic treatment including Tylenol, rest, hydration.  Instructed patient to follow up with PCP if her symptoms are not improving  Patient agrees to plan of care.    Final Clinical Impressions(s) / UC Diagnoses   Final diagnoses:  Viral illness     Discharge Instructions     Your COVID and Flu tests are pending.  You should self quarantine until the test results are back.    Take Tylenol or ibuprofen as needed for fever or discomfort.  Rest and keep yourself hydrated.    Follow-up with your primary care provider if your symptoms are not improving.        ED Prescriptions    None     PDMP not reviewed this encounter.   Mickie Bail, NP 08/13/20 1824

## 2020-08-14 LAB — RESP PANEL BY RT-PCR (FLU A&B, COVID) ARPGX2
Influenza A by PCR: NEGATIVE
Influenza B by PCR: NEGATIVE
SARS Coronavirus 2 by RT PCR: POSITIVE — AB

## 2020-08-15 ENCOUNTER — Telehealth: Payer: Self-pay | Admitting: Nurse Practitioner

## 2020-08-15 NOTE — Telephone Encounter (Signed)
Called to Discuss with patient about Covid symptoms and the use of the monoclonal antibody infusion for those with mild to moderate Covid symptoms and at a high risk of hospitalization.     Patient reports feeling much better.  Unfortunately, she does not qualify for monoclonal antibody infusion due to experiencing symptoms greater than 10 days.  Ocie Bob, AGNP-C

## 2020-09-20 ENCOUNTER — Other Ambulatory Visit: Payer: Self-pay

## 2020-09-20 ENCOUNTER — Ambulatory Visit (INDEPENDENT_AMBULATORY_CARE_PROVIDER_SITE_OTHER): Payer: Self-pay

## 2020-09-20 DIAGNOSIS — Z3200 Encounter for pregnancy test, result unknown: Secondary | ICD-10-CM

## 2020-09-20 DIAGNOSIS — Z3201 Encounter for pregnancy test, result positive: Secondary | ICD-10-CM

## 2020-09-20 LAB — POCT URINE PREGNANCY: Preg Test, Ur: POSITIVE — AB

## 2020-09-20 NOTE — Progress Notes (Signed)
..   Ms. Windom presents today for UPT. She has no unusual complaints. LMP:07-29-20    OBJECTIVE: Appears well, in no apparent distress.  OB History    Gravida  4   Para  2   Term  1   Preterm  1   AB  1   Living  2     SAB  0   IAB  1   Ectopic  0   Multiple  0   Live Births  2          Home UPT Result:Positive In-Office UPT result:Positive I have reviewed the patient's medical, obstetrical, social, and family histories, and medications.   ASSESSMENT: Positive pregnancy test  PLAN Prenatal care to be completed at: [facility]

## 2020-09-20 NOTE — Progress Notes (Signed)
Patient was assessed and managed by nursing staff during this encounter. I have reviewed the chart and agree with the documentation and plan. I have also made any necessary editorial changes.  Jaziyah Gradel A Derrik Mceachern, MD 09/20/2020 4:33 PM   

## 2020-10-16 ENCOUNTER — Encounter: Payer: Medicaid Other | Admitting: Advanced Practice Midwife

## 2021-01-19 ENCOUNTER — Other Ambulatory Visit: Payer: Self-pay | Admitting: Obstetrics

## 2021-02-05 ENCOUNTER — Other Ambulatory Visit: Payer: Self-pay | Admitting: Obstetrics

## 2021-02-06 NOTE — Telephone Encounter (Signed)
Changes Requested   Name from pharmacy: VALACYCLOVIR HCL 500 MG TABLET       Will file in chart as: valACYclovir (VALTREX) 500 MG tablet   Sig: TAKE 1 TABLET BY MOUTH TWICE A DAY   Disp:  180 tablet    Refills:  1   Start: 02/05/2021   Class: Normal   Non-formulary   Last ordered: 2 weeks ago by Brock Bad, MD    Rx #: [id number]   Pharmacy comment: REQUEST FOR 90 DAYS PRESCRIPTION.     To be filled at: [id number] - [place], [place] - [address]

## 2021-07-10 ENCOUNTER — Encounter: Payer: Self-pay | Admitting: Emergency Medicine

## 2021-07-10 ENCOUNTER — Encounter: Payer: Self-pay | Admitting: Hematology and Oncology

## 2021-07-10 ENCOUNTER — Ambulatory Visit
Admission: EM | Admit: 2021-07-10 | Discharge: 2021-07-10 | Disposition: A | Discharge status: Home / Self Care | Payer: Medicaid Other | Attending: Internal Medicine | Admitting: Internal Medicine

## 2021-07-10 ENCOUNTER — Other Ambulatory Visit: Payer: Self-pay

## 2021-07-10 ENCOUNTER — Ambulatory Visit
Admission: EM | Admit: 2021-07-10 | Discharge: 2021-07-10 | Disposition: A | Discharge status: Home / Self Care | Payer: Medicaid Other

## 2021-07-10 DIAGNOSIS — J101 Influenza due to other identified influenza virus with other respiratory manifestations: Secondary | ICD-10-CM

## 2021-07-10 DIAGNOSIS — R051 Acute cough: Secondary | ICD-10-CM

## 2021-07-10 DIAGNOSIS — R52 Pain, unspecified: Secondary | ICD-10-CM

## 2021-07-10 LAB — POCT INFLUENZA A/B
Influenza A, POC: POSITIVE — AB
Influenza B, POC: NEGATIVE

## 2021-07-10 MED ORDER — OSELTAMIVIR PHOSPHATE 75 MG PO CAPS: 75.0000 mg | ORAL_CAPSULE | Freq: Two times a day (BID) | ORAL | 0 refills | Status: DC

## 2021-07-10 NOTE — Discharge Instructions (Signed)
You have tested positive for influenza A.  Your COVID-19 test is pending.  We will call if it is positive.  Tamiflu has been sent to help treat flu.

## 2021-07-10 NOTE — ED Notes (Signed)
Patient left, was discharged from system. Came back and was re-registered. Provider charted orders on previous encounter, whereas the remainder of the charting is listed on this encounter. Tested positive for Flu A and is being treated/discharged at this time.

## 2021-07-10 NOTE — ED Provider Notes (Addendum)
[name]    CSN: [id number] Arrival date & time: 07/10/21  1555      History   Chief Complaint No chief complaint on file.   HPI Bridget Leach is a 30 y.o. female.   Patient presents with generalized body aches, dry cough, chills, fatigue that started yesterday.  Denies any known fevers.  Somebody at her workplace tested positive for the flu.  Patient denies chest pain, shortness of breath, nausea, vomiting, diarrhea, abdominal pain.  Patient's employer is requesting COVID-19 test before returning to work.  Patient has taken ibuprofen and cough drops with minimal improvement in symptoms.    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 Authorizing Provider  oseltamivir (TAMIFLU) 75 MG capsule Take 1 capsule (75 mg total) by mouth every 12 (twelve) hours. 07/10/21  Yes Dewain Platz, Acie Fredrickson, FNP  predniSONE (DELTASONE) 50 MG tablet Take 1 tablet (50 mg total) by mouth daily with breakfast. Patient not taking: Reported on 09/20/2020 03/22/20   Belinda Fisher, PA-C  valACYclovir (VALTREX) 1000 MG tablet valacyclovir 1 gram  tablet  TK 1 T PO BID FOR 10 DAYS    [provider]  valACYclovir (VALTREX) 500 MG tablet TAKE 1 TABLET BY MOUTH TWICE A DAY 02/07/21   Brock Bad, MD    Family History Family History  Problem Relation Age of Onset   Diabetes Maternal Grandmother    Hypertension Maternal Grandmother    Cancer Maternal Grandmother        leukemia   Diabetes Maternal Grandfather    Hypertension Maternal Grandfather     Social History Social History   Tobacco Use   Smoking status: Never   Smokeless tobacco: Never  Vaping Use   Vaping Use: Never used  Substance Use Topics   Alcohol use: No   Drug use: No    Frequency: 1.0 times per week    Types: Marijuana     Allergies   Clindamycin/lincomycin   Review of Systems Review of Systems Per HPI  Physical Exam Triage Vital Signs ED Triage Vitals  Enc Vitals Group     BP      Pulse      Resp      Temp      Temp src  SpO2      Weight      Height      Head Circumference      Peak Flow      Pain Score      Pain Loc      Pain Edu?      Excl. in GC?    No data found.  Updated Vital Signs LMP 07/29/2020   Visual Acuity Right Eye Distance:   Left Eye Distance:   Bilateral Distance:    Right Eye Near:   Left Eye Near:    Bilateral Near:     Physical Exam Constitutional:      General: She is not in acute distress.    Appearance: Normal appearance. She is not toxic-appearing or diaphoretic.  HENT:     Head: Normocephalic and atraumatic.     Right Ear: Tympanic membrane and ear canal normal.     Left Ear: Tympanic membrane and ear canal normal.     Nose: Nose normal.     Mouth/Throat:     Mouth: Mucous membranes are moist.     Pharynx: No posterior oropharyngeal erythema.  Eyes:     Extraocular Movements: Extraocular movements intact.     Conjunctiva/sclera: Conjunctivae normal.     Pupils: Pupils are equal, round, and reactive to light.  Cardiovascular:     Rate and Rhythm: Normal rate and  regular rhythm.     Pulses: Normal pulses.     Heart sounds: Normal heart sounds.  Pulmonary:     Effort: Pulmonary effort is normal. No respiratory distress.     Breath sounds: Normal breath sounds. No stridor. No wheezing, rhonchi or rales.  Abdominal:     General: Abdomen is flat. Bowel sounds are normal.     Palpations: Abdomen is soft.  Musculoskeletal:        General: Normal range of motion.     Cervical back: Normal range of motion.  Skin:    General: Skin is warm and dry.  Neurological:     General: No focal deficit present.     Mental Status: She is alert and oriented to person, place, and time. Mental status is at baseline.  Psychiatric:        Mood and Affect: Mood normal.        Behavior: Behavior normal.     UC Treatments / Results  Labs (all labs ordered are listed, but only abnormal results are displayed) Labs Reviewed  POCT INFLUENZA A/B - Abnormal; Notable for the following components:      Result Value   Influenza A, POC Positive (*)    All other components within normal limits  NOVEL CORONAVIRUS, NAA    EKG   Radiology No results found.  Procedures Procedures (including critical care time)  Medications Ordered in UC Medications - No data to display  Initial Impression / Assessment and Plan / UC Course  I have reviewed the triage vital signs and the nursing notes.  Pertinent labs & imaging results that were available during my care of the patient were reviewed by me and considered in my medical decision making (see chart for details).     Patient tested positive for the flu.  Will treat with Tamiflu x5 days.  Discussed supportive care and symptomatic management with patient.  COVID-19 testing pending per patient request and employer requirement.  Patient has 2 different charts for this visit today due to patient leaving and coming back after nursing staff thought that she  left the building.  Flu results are associated with other chart.  No red  flags on exam.  Discussed strict return precautions.  Patient verbalized understanding is agreeable plan.  Vitals not showing up on this chart for some reason.  Vital signs were all normal on original triage. Final Clinical Impressions(s) / UC Diagnoses   Final diagnoses:  Generalized body aches  Acute cough  Influenza A     Discharge Instructions      You have tested positive for influenza A.  Your COVID-19 test is pending.  We will call if it is positive.  Tamiflu has been sent to help treat flu.     ED Prescriptions     Medication Sig Dispense Auth. Provider   oseltamivir (TAMIFLU) 75 MG capsule Take 1 capsule (75 mg total) by mouth every 12 (twelve) hours. 10 capsule Gustavus Bryant, [place]      PDMP not reviewed this encounter.   Gustavus Bryant, [place] 07/10/21 1843    Gustavus Bryant, [place] 07/10/21 [phone]

## 2021-07-10 NOTE — ED Triage Notes (Signed)
Last night started having generalized body aches. Woke up this morning with a cough, chill, and fatigue. Work told her she needed to get a covid test, states someone at her job recently had the flu

## 2021-07-10 NOTE — ED Notes (Signed)
Patient left before triaged and return after being discharged from the initial encounter. Patient was later put back in the system with a new encounter to be seen by the provider. Provider Laren Everts placed Covid and Flu orders under the initial encounter (this one) and not the new visit in which the patient was actually seen.

## 2021-07-11 ENCOUNTER — Telehealth: Payer: Medicaid Other | Admitting: Physician Assistant

## 2021-07-11 DIAGNOSIS — J101 Influenza due to other identified influenza virus with other respiratory manifestations: Secondary | ICD-10-CM

## 2021-07-11 LAB — NOVEL CORONAVIRUS, NAA: SARS-CoV-2, NAA: NOT DETECTED

## 2021-07-11 LAB — SARS-COV-2, NAA 2 DAY TAT

## 2021-07-11 MED ORDER — OSELTAMIVIR PHOSPHATE 75 MG PO CAPS: 75.0000 mg | ORAL_CAPSULE | Freq: Two times a day (BID) | ORAL | 0 refills | Status: DC

## 2021-07-11 NOTE — Patient Instructions (Signed)
  Bridget Leach, thank you for joining Piedad Climes, PA-C for today's virtual visit.  While this provider is not your primary care provider (PCP), if your PCP is located in our provider database this encounter information will be shared with them immediately following your visit.  Consent: (Patient) Bridget Leach provided verbal consent for this virtual visit at the beginning of the encounter.  Current Medications:  Current Outpatient Medications:    oseltamivir (TAMIFLU) 75 MG capsule, Take 1 capsule (75 mg total) by mouth every 12 (twelve) hours., Disp: 10 capsule, Rfl: 0   predniSONE (DELTASONE) 50 MG tablet, Take 1 tablet (50 mg total) by mouth daily with breakfast. (Patient not taking: Reported on 09/20/2020), Disp: 5 tablet, Rfl: 0   valACYclovir (VALTREX) 1000 MG tablet, valacyclovir 1 gram tablet  TK 1 T PO BID FOR 10 DAYS, Disp: , Rfl:    valACYclovir (VALTREX) 500 MG tablet, TAKE 1 TABLET BY MOUTH TWICE A DAY, Disp: 180 tablet, Rfl: 1   Medications ordered in this encounter:  Meds ordered this encounter  Medications   oseltamivir (TAMIFLU) 75 MG capsule    Sig: Take 1 capsule (75 mg total) by mouth every 12 (twelve) hours.    Dispense:  10 capsule    Refill:  0    Order Specific Question:   Supervising Provider    Answer:   Hyacinth Meeker, BRIAN [[id number]]     *If you need refills on other medications prior to your next appointment, please contact your pharmacy*  Follow-Up: Call back or seek an in-person evaluation if the symptoms worsen or if the condition fails to improve as anticipated.  Other Instructions Please try to get the Tamiflu from [facility]. Take as directed. Keep hydrated.  You can continue tylenol or switch for OTC Theraflu (contains tylenol along with other medication for your symptoms). You can take Ibuprofen between doses of tylenol-containing medications if needed, no more than three times daily.  If you have a humidifier, run in the bedroom at night. Try  to use a heating pad for 10-15 minutes or a hot bath to help with aches.   If anything worsens, you need to be evaluated again in person.    If you have been instructed to have an in-person evaluation today at a local Urgent Care facility, please use the link below. It will take you to a list of all of our available [facility], including address, phone number and hours of operation. Please do not delay care.  [facility]  If you or a family member do not have a primary care provider, use the link below to schedule a visit and establish care. When you choose a [facility] primary care physician or advanced practice provider, you gain a long-term partner in health. Find a Primary Care Provider  Learn more about [facility]'s in-office and virtual care options: [facility] - Get Care Now

## 2021-07-11 NOTE — Progress Notes (Signed)
Virtual Visit Consent   Karita Dralle, you are scheduled for a virtual visit with a [facility] provider today.     Just as with appointments in the office, your consent must be obtained to participate.  Your consent will be active for this visit and any virtual visit you may have with one of our providers in the next 365 days.     If you have a MyChart account, a copy of this consent can be sent to you electronically.  All virtual visits are billed to your insurance company just like a traditional visit in the office.    As this is a virtual visit, video technology does not allow for your provider to perform a traditional examination.  This may limit your provider's ability to fully assess your condition.  If your provider identifies any concerns that need to be evaluated in person or the need to arrange testing (such as labs, EKG, etc.), we will make arrangements to do so.     Although advances in technology are sophisticated, we cannot ensure that it will always work on either your end or our end.  If the connection with a video visit is poor, the visit may have to be switched to a telephone visit.  With either a video or telephone visit, we are not always able to ensure that we have a secure connection.     I need to obtain your verbal consent now.   Are you willing to proceed with your visit today?    Bridget Leach has provided verbal consent on 07/11/2021 for a virtual visit (video or telephone).   Piedad Climes, [place]   Date: 07/11/2021 4:30 PM   Virtual Visit via Video Note   I, Piedad Climes, connected with  Bridget Leach  ([id number], [birth year]) on 07/11/21 at  4:30 PM EDT by a video-enabled telemedicine application and verified that I am speaking with the correct person using two identifiers.  Location: Patient: Virtual Visit Location Patient: Home Provider: Virtual Visit Location Provider: Home Office   I discussed the limitations of evaluation and management  by telemedicine and the availability of in person appointments. The patient expressed understanding and agreed to proceed.    History of Present Illness: Bridget Leach is a 30 y.o. who identifies as a female who was assigned female at birth, and is being seen today for Influenza A. Was seen at [facility] yesterday and tested positive for flu A. Ws given script for Tamiflu and discharged home. She notes she is still having substantial issue with body aches, also noting chest wall tenderness with coughing. Tylenol helps but does not resolve aches. Was unable to get Tamiflu as she said that [facility]. States she called the UC she was seen at today several times but was unable to get through, leaving a message without a callback yet.   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 (Patient not taking: Reported on 09/20/2020), Disp: 5 tablet, Rfl: 0   valACYclovir (VALTREX) 1000 MG tablet, valacyclovir 1 gram tablet  TK 1 T PO BID FOR 10 DAYS, Disp: , Rfl:    valACYclovir (VALTREX) 500 MG tablet, TAKE 1 TABLET BY MOUTH TWICE A DAY, Disp: 180 tablet, Rfl: 1  Observations/Objective: Patient is well-developed, well-nourished in no acute  distress.  Resting comfortably at home.  Head is normocephalic, atraumatic.  No labored breathing. Speech is clear and coherent with logical content.  Patient is alert and oriented at baseline.   Assessment and Plan: 1. Influenza A - oseltamivir (TAMIFLU) 75 MG capsule; Take 1 capsule (75 mg total) by mouth every 12 (twelve) hours.  Dispense: 10 capsule; Refill: 0 [facility]ent [facility]iflu [facility] nearby for her to start taking as directed. Supportive measures and OTC medications reviewed. Will extend work note through the weekend. Strict UC/ER precautions reviewed.   Follow Up Instructions: I discussed the assessment and treatment plan with the patient. The patient was provided an opportunity to ask questions and all were answered. The patient agreed with the plan and demonstrated an understanding of the instructions.  A copy of instructions were sent to the patient via MyChart unless otherwise noted below.   The patient was advised to call back or seek an in-person evaluation if the symptoms worsen or if the condition fails to improve as anticipated.  Time:  I spent 15 minutes with the patient via telehealth technology discussing the above problems/concerns.    Piedad Climes, PA-C

## 2021-11-23 ENCOUNTER — Encounter (HOSPITAL_COMMUNITY): Payer: Self-pay | Admitting: Obstetrics and Gynecology

## 2021-11-23 ENCOUNTER — Other Ambulatory Visit: Payer: Self-pay

## 2021-11-23 ENCOUNTER — Inpatient Hospital Stay (HOSPITAL_COMMUNITY)
Admission: AD | Admit: 2021-11-23 | Discharge: 2021-11-23 | Disposition: A | Discharge status: Home / Self Care | Payer: Medicaid Other | Attending: Obstetrics and Gynecology | Admitting: Obstetrics and Gynecology

## 2021-11-23 ENCOUNTER — Ambulatory Visit
Admission: EM | Admit: 2021-11-23 | Discharge: 2021-11-23 | Payer: Medicaid Other | Attending: Physician Assistant | Admitting: Physician Assistant

## 2021-11-23 DIAGNOSIS — Z3490 Encounter for supervision of normal pregnancy, unspecified, unspecified trimester: Secondary | ICD-10-CM

## 2021-11-23 DIAGNOSIS — R0989 Other specified symptoms and signs involving the circulatory and respiratory systems: Secondary | ICD-10-CM | POA: Diagnosis not present

## 2021-11-23 DIAGNOSIS — O26891 Other specified pregnancy related conditions, first trimester: Secondary | ICD-10-CM | POA: Insufficient documentation

## 2021-11-23 DIAGNOSIS — J00 Acute nasopharyngitis [common cold]: Secondary | ICD-10-CM | POA: Insufficient documentation

## 2021-11-23 DIAGNOSIS — R109 Unspecified abdominal pain: Secondary | ICD-10-CM

## 2021-11-23 DIAGNOSIS — Z3201 Encounter for pregnancy test, result positive: Secondary | ICD-10-CM | POA: Insufficient documentation

## 2021-11-23 DIAGNOSIS — Z20822 Contact with and (suspected) exposure to covid-19: Secondary | ICD-10-CM | POA: Diagnosis not present

## 2021-11-23 DIAGNOSIS — Z3A01 Less than 8 weeks gestation of pregnancy: Secondary | ICD-10-CM | POA: Insufficient documentation

## 2021-11-23 LAB — POCT URINE PREGNANCY: Preg Test, Ur: POSITIVE — AB

## 2021-11-23 LAB — RESP PANEL BY RT-PCR (FLU A&B, COVID) ARPGX2
Influenza A by PCR: NEGATIVE
Influenza B by PCR: NEGATIVE
SARS Coronavirus 2 by RT PCR: NEGATIVE

## 2021-11-23 NOTE — MAU Note (Signed)
Bridget Leach is a 31 y.o. here in MAU reporting: she was seen @ Urgent Care d/t not feeling well.  Reports nausea, no vomiting, fatigue, hot/cold flashes.  UPT done @ Urgent Care secondary pt had spotting & cramping 2 days ago, none currently.  Instructed to go to MAU for evaluation. ?LMP: last month ?Onset of complaint: last week for "not feeling well" 2 days ago spotting & cramping, now resolved ?Pain score: 0 ?Vitals:  ? 11/23/21 1141  ?BP: (!) 122/92  ?Pulse: 80  ?Resp: 20  ?Temp: 98.2 ?F (36.8 ?C)  ?SpO2: 100%  ?   ?FHT:N/A ?Lab orders placed from triage:   None ?

## 2021-11-23 NOTE — MAU Provider Note (Signed)
S ?Ms. Bridget Leach is a 31 y.o. [phone] @[redacted]w[redacted]d  by unsure LMP who presents to MAU today with complaint of +pregnancy test at urgent care today and cold sx. Reports cough and nasal congestion last week.  Endorses body aches and generalized malaise.  Symptoms have improved since last week.  She has not tested for flu or COVID.  Denies sick contacts.  Reports a small amount of VB 2 days ago, none since.  Denies abdominal pain. ? ?ROS: ?+nausea ?No vomiting ?No fever ?No cough ?No abd pain ?No VB ? ?O ?BP (!) 122/92 (BP Location: Right Arm)   Pulse 80   Temp 98.2 ?F (36.8 ?C) (Oral)   Resp 20   Ht 5\' 6"  (1.676 m)   Wt 77.2 kg   LMP 10/26/2021 (Approximate) Comment: last month  SpO2 100%   BMI 27.49 kg/m?  ?Physical Exam ?Vitals and nursing note reviewed.  ?Constitutional:   ?   General: She is not in acute distress. ?   Appearance: Normal appearance.  ?HENT:  ?   Head: Normocephalic and atraumatic.  ?Cardiovascular:  ?   Rate and Rhythm: Normal rate.  ?Pulmonary:  ?   Effort: Pulmonary effort is normal. No respiratory distress.  ?Musculoskeletal:     ?   General: Normal range of motion.  ?   Cervical back: Normal range of motion.  ?Neurological:  ?   General: No focal deficit present.  ?   Mental Status: She is alert and oriented to person, place, and time.  ?Psychiatric:     ?   Mood and Affect: Mood normal.     ?   Behavior: Behavior normal.  ? ?Results for orders placed or performed during the hospital encounter of 11/23/21 (from the past 24 hour(s))  ?POCT urine pregnancy     Status: Abnormal  ? Collection Time: 11/23/21  9:23 AM  ?Result Value Ref Range  ? Preg Test, Ur Positive (A) Negative  ? ?MDM: No signs warranting emergent evaluation today.  Will test for respiratory panel.  Recommend supportive care for symptoms.  List provided.  Discussed SAB/ectopic return precautions.  Plans to start care at [facility].  Stable for discharge. ? ?A ?1. Positive pregnancy test   ?2. Respiratory symptoms    ? ? ?P ?Discharge from MAU in stable condition ?Pregnancy verification letter provided ?Warning signs for worsening condition that would warrant emergency follow-up discussed ?Patient may return to MAU as needed for pregnancy related complaints ? ?11/25/21, CNM ?11/23/2021 12:04 PM , ?

## 2021-11-23 NOTE — ED Provider Notes (Signed)
?[place] ? ? ? ?CSN: [id number] ?Arrival date & time: 11/23/21  [id number] ? ? ?  ? ?History   ?Chief Complaint ?Chief Complaint  ?Patient presents with  ? Abdominal Pain  ? ? ?HPI ?Bridget Leach is a 31 y.o. female.  ? ?Here today for evaluation of multiple complaints.  She reports she has had some nose pain, abdominal pain and nausea.  She has not had any vomiting or diarrhea.  She states that she has had loss of taste and thinks smell weird.  Her appetite is also been decreased.  She states she did have a period last month.  She notes that a few days ago she did have significant abdominal cramping and vaginal bleeding but this was very short lived.  ? ?The history is provided by the patient.  ?Abdominal Pain ?Associated symptoms: nausea   ?Associated symptoms: no chills, no diarrhea, no fever, no shortness of breath, no sore throat and no vomiting   ? ?Past Medical History:  ?Diagnosis Date  ? Chlamydia 06/24/15  ? Herpes genitalia   ? Sickle cell trait carrier 12/22/2012  ? Hgb AS  ? ? ?Patient Active Problem List  ? Diagnosis Date Noted  ? Nexplanon in place 04/17/2017  ? Postpartum care and examination 03/17/2017  ? Implanon insertion 03/17/2017  ? Iron deficiency anemia 01/09/2017  ? Low vitamin D level 10/21/2016  ? 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 Genital herpes simplex 10/12/2015  ? Cannabis abuse 10/12/2015  ? Sickle cell trait carrier 12/22/2012  ? ? ?Past Surgical History:  ?Procedure Laterality Date  ? DENTAL SURGERY    ? DILATION AND CURETTAGE OF UTERUS    ? ? ?OB History   ? ? Gravida  ?4  ? Para  ?2  ? Term  ?1  ? Preterm  ?1  ? AB  ?1  ? Living  ?2  ?  ? ? SAB  ?0  ? IAB  ?1  ? Ectopic  ?0  ? Multiple  ?0  ? Live Births  ?2  ?   ?  ?  ? ? ? ?Home Medications   ? ?Prior to Admission medications   ?Medication Sig Start Date End Date Taking? Authorizing Provider  ?oseltamivir (TAMIFLU) 75 MG capsule Take 1 capsule (75 mg total) by mouth every 12 (twelve) hours. 07/11/21   Brunetta Jeans, PA-C  ?predniSONE (DELTASONE) 50 MG tablet Take 1 tablet (50 mg total) by mouth daily with breakfast. ?Patient not taking: Reported on 09/20/2020 03/22/20   Ok Edwards, PA-C  ?valACYclovir (VALTREX) 1000 MG tablet valacyclovir 1 gram tablet ? TK 1 T PO BID FOR 10 DAYS    [provider]  ?valACYclovir (VALTREX) 500 MG tablet TAKE 1 TABLET BY MOUTH TWICE A DAY 02/07/21   Shelly Bombard, MD  ? ? ?Family History ?Family History  ?Problem Relation Age of Onset  ? Healthy Mother   ? Healthy Father   ? Diabetes Maternal Grandmother   ? Hypertension Maternal Grandmother   ? Cancer Maternal Grandmother   ?     leukemia  ? Diabetes Maternal Grandfather   ? Hypertension Maternal Grandfather   ? ? ?Social History ?Social History  ? ?Tobacco Use  ? Smoking status: Never  ? Smokeless tobacco: Never  ?Vaping Use  ? Vaping Use: Never used  ?Substance Use Topics  ? Alcohol use: No  ? Drug use: No  ?  Frequency: 1.0 times per week  ?  Types: Marijuana  ? ? ? ?Allergies   ?Clindamycin/lincomycin ? ? ?Review of Systems ?Review of Systems  ?Constitutional:  Negative for chills and fever.  ?HENT:  Negative for congestion, ear pain and sore throat.   ?Eyes:  Negative for discharge and redness.  ?Respiratory:  Negative for shortness of breath and wheezing.   ?Gastrointestinal:  Positive for abdominal pain and nausea. Negative for diarrhea and vomiting.  ? ? ?Physical Exam ?Triage Vital Signs ?ED Triage Vitals  ?Enc Vitals Group  ?   BP 11/23/21 0913 123/79  ?   Pulse Rate 11/23/21 0913 86  ?   Resp 11/23/21 0913 18  ?   Temp 11/23/21 0913 98.1 ?F (36.7 ?C)  ?   Temp Source 11/23/21 0913 Oral  ?   SpO2 11/23/21 0913 99 %  ?   Weight --   ?   Height --   ?   Head Circumference --   ?   Peak Flow --   ?   Pain Score 11/23/21 0914 7  ?   Pain Loc --   ?   Pain Edu? --   ?   Excl. in [place]? --   ? ?No data found. ? ?Updated Vital Signs ?BP 123/79 (BP Location: Left Arm)   Pulse 86   Temp 98.1 ?F (36.7 ?C) (Oral)   Resp 18    LMP  (LMP Unknown)   SpO2 99%   Breastfeeding No  ?   ? ?Physical Exam ?Vitals and nursing note reviewed.  ?Constitutional:   ?   General: She is not in acute distress. ?   Appearance: Normal appearance. She is not ill-appearing.  ?HENT:  ?   Head: Normocephalic and atraumatic.  ?   Nose: No congestion or rhinorrhea.  ?   Mouth/Throat:  ?   Mouth: Mucous membranes are moist.  ?   Pharynx: No oropharyngeal exudate or posterior oropharyngeal erythema.  ?Eyes:  ?   Conjunctiva/sclera: Conjunctivae normal.  ?Cardiovascular:  ?   Rate and Rhythm: Normal rate.  ?Pulmonary:  ?   Effort: Pulmonary effort is normal. No respiratory distress.  ?Skin: ?   General: Skin is warm and dry.  ?Neurological:  ?   Mental Status: She is alert.  ?Psychiatric:     ?   Mood and Affect: Mood normal.     ?   Thought Content: Thought content normal.  ? ? ? ?UC Treatments / Results  ?Labs ?(all labs ordered are listed, but only abnormal results are displayed) ?Labs Reviewed  ?POCT URINE PREGNANCY - Abnormal; Notable for the following components:  ?    Result Value  ? Preg Test, Ur Positive (*)   ? All other components within normal limits  ? ? ?EKG ? ? ?Radiology ?No results found. ? ?Procedures ?Procedures (including critical care time) ? ?Medications Ordered in UC ?Medications - No data to display ? ?Initial Impression / Assessment and Plan / UC Course  ?I have reviewed the triage vital signs and the nursing notes. ? ?Pertinent labs & imaging results that were available during my care of the patient were reviewed by me and considered in my medical decision making (see chart for details). ? ? Pregnancy test positive. Recommended further evaluation in MAU given reported abdominal discomfort.  ? ? ?Final Clinical Impressions(s) / UC Diagnoses  ? ?Final diagnoses:  ?Pregnancy, unspecified gestational age  ?Abdominal discomfort  ? ?Discharge Instructions   ?None ?  ? ?ED Prescriptions   ?None ?  ? ?  PDMP not reviewed this encounter. ?  ?Francene Finders, PA-C ?11/23/21 0935 ? ?

## 2021-11-23 NOTE — Discharge Instructions (Signed)

## 2021-11-23 NOTE — ED Triage Notes (Signed)
One week h/o nose pain, 5 days of abdominal pain and 2 days of nausea. Denies v/d.  ?Notes chills, loss of taste and "everything smells weird". Appetite is decreased.Pt describes abdominal sxs as if she ate something bad and that its sitting in her stomach. ?

## 2021-12-05 ENCOUNTER — Telehealth: Payer: Medicaid Other | Admitting: Physician Assistant

## 2021-12-05 NOTE — Progress Notes (Signed)
Trying to do visit for her daughter through her chart. Daughter is pediatric patient. Resources given to help her be evaluated.  ?

## 2022-03-21 ENCOUNTER — Other Ambulatory Visit: Payer: Self-pay | Admitting: Obstetrics

## 2022-05-25 ENCOUNTER — Encounter (HOSPITAL_COMMUNITY): Payer: Self-pay

## 2022-05-25 ENCOUNTER — Other Ambulatory Visit: Payer: Self-pay

## 2022-05-25 ENCOUNTER — Emergency Department (HOSPITAL_COMMUNITY)
Admission: EM | Admit: 2022-05-25 | Discharge: 2022-05-25 | Disposition: A | Discharge status: Home / Self Care | Payer: Medicaid Other | Attending: Emergency Medicine | Admitting: Emergency Medicine

## 2022-05-25 DIAGNOSIS — K0889 Other specified disorders of teeth and supporting structures: Secondary | ICD-10-CM | POA: Insufficient documentation

## 2022-05-25 DIAGNOSIS — R519 Headache, unspecified: Secondary | ICD-10-CM | POA: Diagnosis not present

## 2022-05-25 MED ORDER — KETOROLAC TROMETHAMINE 15 MG/ML IJ SOLN
15.0000 mg | Freq: Once | INTRAMUSCULAR | Status: AC
  Administered 2022-05-25: 15 mg via INTRAMUSCULAR
  Filled 2022-05-25: qty 1

## 2022-05-25 MED ORDER — AMOXICILLIN-POT CLAVULANATE 875-125 MG PO TABS: 1.0000 | ORAL_TABLET | Freq: Two times a day (BID) | ORAL | 0 refills | Status: AC

## 2022-05-25 MED ORDER — IBUPROFEN 800 MG PO TABS: 800.0000 mg | ORAL_TABLET | Freq: Three times a day (TID) | ORAL | 0 refills | Status: AC

## 2022-05-25 NOTE — ED Triage Notes (Signed)
Patient c/o right upper dental pain,  headache, and right ear pain x 1 week.

## 2022-05-25 NOTE — Discharge Instructions (Addendum)
You were seen today for dental pain.  I suspect you have an early dental infection, take the ibuprofen to milligrams 3 times daily with food and water for the next week.  Take the Augmentin twice daily for 7 days.  Follow-up with a dentist, information above.  Return to ED if you are unable to swallow, significant facial swelling or new concerning symptoms.

## 2022-05-25 NOTE — ED Provider Notes (Signed)
[facility]-EMERGENCY DEPT Provider Note   CSN: [id number] Arrival date & time: 05/25/22  0746     History  Chief Complaint  Patient presents with   Dental Pain    Bridget Leach is a 31 y.o. female.   Dental Pain    Patient presents with right upper dental pain.  This started a week ago, its constant.  Its worse whenever he chews, denies any vision changes but does endorse associated headache.  She has not had any medicine prior to arrival, does not currently have a dentist.  Denies any difficulty swallowing or change in phonation  Home Medications Prior to Admission medications   Medication Sig Start Date End Date Taking? Authorizing Provider  amoxicillin-clavulanate (AUGMENTIN) 875-125 MG tablet Take 1 tablet by mouth every 12 (twelve) hours. 05/25/22  Yes Theron Arista, PA-C  ibuprofen (ADVIL) 800 MG tablet Take 1 tablet (800 mg total) by mouth 3 (three) times daily. 05/25/22  Yes Theron Arista, PA-C      Allergies    Clindamycin/lincomycin    Review of Systems   Review of Systems  Physical Exam Updated Vital Signs BP (!) 131/92 (BP Location: Left Arm)   Pulse 75   Temp 98.5 F (36.9 C) (Oral)   Resp 17   Ht 5\' 6"  (1.676 m)   Wt 72.6 kg   LMP 05/07/2022 (Approximate) Comment: last month  SpO2 100%   BMI 25.82 kg/m  Physical Exam Vitals and nursing note reviewed. Exam conducted with a chaperone present.  Constitutional:      General: She is not in acute distress.    Appearance: Normal appearance.  HENT:     Head: Normocephalic and atraumatic.     Mouth/Throat:      Comments: Uvula is midline, normal phonation.  Handling secretions, no sublingual tenderness or swelling Eyes:     General: No scleral icterus.    Extraocular Movements: Extraocular movements intact.     Pupils: Pupils are equal, round, and reactive to light.  Skin:    Coloration: Skin is not jaundiced.  Neurological:     Mental Status: She is alert. Mental status is at  baseline.     Coordination: Coordination normal.     ED Results / Procedures / Treatments   Labs (all labs ordered are listed, but only abnormal results are displayed) Labs Reviewed - No data to display  EKG None  Radiology No results found.  Procedures Procedures    Medications Ordered in ED Medications  ketorolac (TORADOL) 15 MG/ML injection 15 mg (15 mg Intramuscular Given 05/25/22 05/27/22)    ED Course/ Medical Decision Making/ A&P                           Medical Decision Making Risk Prescription drug management.   Patient presents due to dental pain.  No signs of Ludwig angina or retropharyngeal abscess or peritonsillar abscess on exam.  Handling secretions, protecting airway.  Patient is nonseptic appearing.  We will cover with antibiotics, anti-inflammatories and dental referral for dental caries versus early dental abscess.  Return precautions were discussed with the patient, discharged stable condition.        Final Clinical Impression(s) / ED Diagnoses Final diagnoses:  Pain, dental    Rx / DC Orders ED Discharge Orders          Ordered    amoxicillin-clavulanate (AUGMENTIN) 875-125 MG tablet  Every 12 hours  05/25/22 0916    ibuprofen (ADVIL) 800 MG tablet  3 times daily        05/25/22 0916              Sherrill Raring, PA-C 05/25/22 Weldon, Julie, MD 05/26/22 [phone]

## 2022-05-27 ENCOUNTER — Ambulatory Visit: Payer: Medicaid Other | Admitting: Advanced Practice Midwife

## 2022-05-27 ENCOUNTER — Other Ambulatory Visit: Payer: Self-pay | Admitting: Advanced Practice Midwife

## 2022-05-27 ENCOUNTER — Ambulatory Visit (INDEPENDENT_AMBULATORY_CARE_PROVIDER_SITE_OTHER): Payer: Medicaid Other | Admitting: Advanced Practice Midwife

## 2022-05-27 ENCOUNTER — Other Ambulatory Visit (HOSPITAL_COMMUNITY)
Admission: RE | Admit: 2022-05-27 | Discharge: 2022-05-27 | Disposition: A | Discharge status: Home / Self Care | Payer: Medicaid Other | Source: Ambulatory Visit | Attending: Advanced Practice Midwife | Admitting: Advanced Practice Midwife

## 2022-05-27 ENCOUNTER — Encounter: Payer: Self-pay | Admitting: Advanced Practice Midwife

## 2022-05-27 VITALS — BP 120/83 | HR 70 | Ht 66.0 in | Wt 177.6 lb

## 2022-05-27 DIAGNOSIS — Z3009 Encounter for other general counseling and advice on contraception: Secondary | ICD-10-CM

## 2022-05-27 DIAGNOSIS — Z01419 Encounter for gynecological examination (general) (routine) without abnormal findings: Secondary | ICD-10-CM

## 2022-05-27 DIAGNOSIS — A6000 Herpesviral infection of urogenital system, unspecified: Secondary | ICD-10-CM

## 2022-05-27 DIAGNOSIS — B9689 Other specified bacterial agents as the cause of diseases classified elsewhere: Secondary | ICD-10-CM

## 2022-05-27 DIAGNOSIS — Z113 Encounter for screening for infections with a predominantly sexual mode of transmission: Secondary | ICD-10-CM | POA: Diagnosis not present

## 2022-05-27 DIAGNOSIS — B3731 Acute candidiasis of vulva and vagina: Secondary | ICD-10-CM

## 2022-05-27 DIAGNOSIS — N760 Acute vaginitis: Secondary | ICD-10-CM

## 2022-05-27 MED ORDER — VALACYCLOVIR HCL 1 G PO TABS: 1000.0000 mg | ORAL_TABLET | Freq: Every day | ORAL | 11 refills | Status: AC

## 2022-05-27 MED ORDER — LIDOCAINE HCL 2 % EX GEL: 1.0000 | CUTANEOUS | 5 refills | Status: AC | PRN

## 2022-05-27 NOTE — Progress Notes (Signed)
Subjective:     Bridget Leach is a 31 y.o. female here at [facility] for a routine exam.  Current complaints: recurrent HSV outbreaks.  Personal health questionnaire reviewed: yes.  Do you have a primary care provider? yes Do you feel safe at home? yes  [place] Office Visit from 05/27/2022 in [place]  PHQ-2 Total Score 0       Health Maintenance Due  Topic Date Due   COVID-19 Vaccine (1) Never done   Hepatitis C Screening  Never done   PAP-Cervical Cytology Screening  10/17/2019   PAP SMEAR-Modifier  10/17/2019   INFLUENZA VACCINE  Never done     Risk factors for chronic health problems: Smoking: Alchohol/how much: Pt BMI: Body mass index is 28.67 kg/m.   Gynecologic History Patient's last menstrual period was 05/07/2022 (approximate). Contraception: condoms Last Pap: 10/16/16. Results were: normal Last mammogram: n/a.   Obstetric History OB History  Gravida Para Term Preterm AB Living  5 2 1 1 1 2   SAB IAB Ectopic Multiple Live Births  0 1 0 0 2    # Outcome Date GA Lbr Len/2nd Weight Sex Delivery Anes PTL Lv  5 Gravida           4 Term 02/14/17 [redacted]w[redacted]d 10:41 / 00:08 6 lb 3.3 oz (2.815 kg) F Vag-Spont EPI  LIV     Birth Comments: WNL  3 Preterm 10/14/15 [redacted]w[redacted]d 22:30 / 00:53 5 lb 2.5 oz (2.34 kg) F Vag-Spont None  LIV  2 Gravida           1 IAB              The following portions of the patient's history were reviewed and updated as appropriate: allergies, current medications, past family history, past medical history, past social history, past surgical history, and problem list.  Review of Systems Pertinent items noted in HPI and remainder of comprehensive ROS otherwise negative.    Objective:   BP 120/83   Pulse 70   Ht 5\' 6"  (1.676 m)   Wt 177 lb 9.6 oz (80.6 kg)   LMP 05/07/2022 (Approximate) Comment: last month  BMI 28.67 kg/m  VS reviewed, nursing note reviewed,  Constitutional: well developed, well  nourished, no distress HEENT: normocephalic CV: normal rate Pulm/chest wall: normal effort Breast Exam:  Deferred with low risks and shared decision making, discussed recommendation to start mammogram between 40-50 yo/ Abdomen: soft Neuro: alert and oriented x 3 Skin: warm, dry Psych: affect normal Pelvic exam: Performed: Cervix pink, visually closed, without lesion, scant white creamy discharge, vaginal walls normal, pt with small linear tears at anal opening, no crusting or drainage Bimanual exam: Cervix 0/long/high, firm, anterior, neg CMT, uterus nontender, nonenlarged, adnexa without tenderness, enlargement, or mass       Assessment/Plan:   1. Women's annual routine gynecological examination  - Cytology - PAP( [facility]) - Cervicovaginal ancillary only( [facility]) - HIV antibody (with reflex) - Hepatitis C Antibody - RPR  2. Routine screening for STI (sexually transmitted infection)   3. 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 Daily for suppression  Dispense: 30 tablet; Refill: 11 - lidocaine (XYLOCAINE) 2 % jelly; Apply 1 Application topically as needed.  Dispense: 30 mL; Refill: 5  4. Encounter for counseling regarding contraception --Discussed pt contraceptive plans and reviewed contraceptive methods based on pt preferences and effectiveness.  Pt prefers to try IUD for improved menses and longer effectiveness for 8 years.  --Pt to return for another appointment per her  preference. Will use condoms until next appt for IUD placement.      Return for Gyn follow up for contraception/IUD insertion.   Sharen Counter, CNM 6:08 PM

## 2022-05-27 NOTE — Progress Notes (Signed)
Patient presents for AEX. Last Pap: 10/16/2016- normal Last Mammogram: not due, no breast concerns today Contraception:  Desires nexplanon  Vaginal/Urinary Symptoms:  STD Screen: desires swab, and blood work Other Concerns: Notes more frequent herpes outbreaks, has not taken valtrex in a few months due to lack of access

## 2022-05-28 LAB — HIV ANTIBODY (ROUTINE TESTING W REFLEX): HIV Screen 4th Generation wRfx: NONREACTIVE

## 2022-05-28 LAB — HEPATITIS C ANTIBODY: Hep C Virus Ab: NONREACTIVE

## 2022-05-28 LAB — RPR: RPR Ser Ql: NONREACTIVE

## 2022-05-29 LAB — CERVICOVAGINAL ANCILLARY ONLY
Bacterial Vaginitis (gardnerella): POSITIVE — AB
Candida Glabrata: NEGATIVE
Candida Vaginitis: POSITIVE — AB
Chlamydia: NEGATIVE
Comment: NEGATIVE
Comment: NEGATIVE
Comment: NEGATIVE
Comment: NEGATIVE
Comment: NEGATIVE
Comment: NORMAL
Neisseria Gonorrhea: NEGATIVE
Trichomonas: NEGATIVE

## 2022-05-30 MED ORDER — METRONIDAZOLE 500 MG PO TABS
500.0000 mg | ORAL_TABLET | Freq: Two times a day (BID) | ORAL | 0 refills | Status: AC
Start: 2022-05-30 — End: 2022-06-06

## 2022-05-30 MED ORDER — FLUCONAZOLE 150 MG PO TABS
ORAL_TABLET | ORAL | 0 refills | Status: AC
Start: 2022-05-30 — End: ?

## 2022-05-30 NOTE — Addendum Note (Signed)
Addended by: Fatima Blank A on: 05/30/2022 10:44 AM   Modules accepted: Orders

## 2022-06-05 LAB — CYTOLOGY - PAP
Comment: NEGATIVE
Diagnosis: UNDETERMINED — AB
High risk HPV: NEGATIVE

## 2022-06-24 ENCOUNTER — Emergency Department (HOSPITAL_BASED_OUTPATIENT_CLINIC_OR_DEPARTMENT_OTHER)
Admission: EM | Admit: 2022-06-24 | Discharge: 2022-06-24 | Disposition: A | Discharge status: Home / Self Care | Payer: Medicaid Other | Attending: Emergency Medicine | Admitting: Emergency Medicine

## 2022-06-24 ENCOUNTER — Other Ambulatory Visit: Payer: Self-pay

## 2022-06-24 ENCOUNTER — Encounter (HOSPITAL_BASED_OUTPATIENT_CLINIC_OR_DEPARTMENT_OTHER): Payer: Self-pay | Admitting: Emergency Medicine

## 2022-06-24 DIAGNOSIS — K029 Dental caries, unspecified: Secondary | ICD-10-CM | POA: Diagnosis not present

## 2022-06-24 DIAGNOSIS — K0889 Other specified disorders of teeth and supporting structures: Secondary | ICD-10-CM | POA: Diagnosis present

## 2022-06-24 MED ORDER — PENICILLIN V POTASSIUM 250 MG PO TABS
500.0000 mg | ORAL_TABLET | Freq: Once | ORAL | Status: AC
Start: 2022-06-24 — End: 2022-06-24
  Administered 2022-06-24: 500 mg via ORAL
  Filled 2022-06-24: qty 2

## 2022-06-24 MED ORDER — PENICILLIN V POTASSIUM 500 MG PO TABS: 500.0000 mg | ORAL_TABLET | Freq: Four times a day (QID) | ORAL | 0 refills | Status: AC

## 2022-06-24 MED ORDER — PENICILLIN V POTASSIUM 500 MG PO TABS: 500.0000 mg | ORAL_TABLET | Freq: Four times a day (QID) | ORAL | 0 refills | Status: DC

## 2022-06-24 MED ORDER — LIDOCAINE VISCOUS HCL 2 % MT SOLN
15.0000 mL | Freq: Once | OROMUCOSAL | Status: AC
  Administered 2022-06-24: 15 mL via OROMUCOSAL
  Filled 2022-06-24: qty 15

## 2022-06-24 NOTE — ED Triage Notes (Signed)
Pt arrives pov, steady gait, c/o RT upper dental pain x 1 month. Pt tx for same in Sept. Pt denies seeing dentist, denies fever

## 2022-06-24 NOTE — ED Provider Notes (Signed)
[facility] EMERGENCY DEPARTMENT Provider Note   CSN: [id number] Arrival date & time: 06/24/22  2236     History  Chief Complaint  Patient presents with   Dental Pain    Bridget Leach is a 31 y.o. female.  The history is provided by the patient.  Dental Pain Location:  Upper Upper teeth location:  15/LU 2nd molar Quality:  Aching Severity:  Moderate Onset quality:  Gradual Duration:  1 month Timing:  Constant Progression:  Unchanged Chronicity:  New Context: not trauma   Previous work-up:  Dental exam Relieved by:  Nothing Worsened by:  Nothing Ineffective treatments:  None tried Associated symptoms: no facial swelling, no fever and no neck swelling        Home Medications Prior to Admission medications   Medication Sig Start Date End Date Taking? Authorizing Provider  penicillin v potassium (VEETID) 500 MG tablet Take 1 tablet (500 mg total) by mouth 4 (four) times daily for 7 days. 06/24/22 07/01/22 Yes Lesette Frary, MD  amoxicillin-clavulanate (AUGMENTIN) 875-125 MG tablet Take 1 tablet by mouth every 12 (twelve) hours. 05/25/22   Sherrill Raring, PA-C  fluconazole (DIFLUCAN) 150 MG tablet Take one tablet now, and one in 3-7 days. 05/30/22   Leftwich-Kirby, Kathie Dike, CNM  ibuprofen (ADVIL) 800 MG tablet Take 1 tablet (800 mg total) by mouth 3 (three) times daily. 05/25/22   Sherrill Raring, PA-C  lidocaine (XYLOCAINE) 2 % jelly Apply 1 Application topically as needed. 05/27/22   Leftwich-Kirby, Kathie Dike, CNM  valACYclovir (VALTREX) 1000 MG tablet Take 1 tablet (1,000 mg total) by mouth daily. Daily for suppression 05/27/22   Leftwich-Kirby, Kathie Dike, CNM      Allergies    Clindamycin/lincomycin    Review of Systems   Review of Systems  Constitutional:  Negative for fever.  HENT:  Negative for facial swelling.   Eyes:  Negative for redness.  Respiratory:  Negative for wheezing and stridor.   All other systems reviewed and are negative.   Physical  Exam Updated Vital Signs BP (!) 144/99 (BP Location: Left Arm)   Pulse 99   Temp 97.8 F (36.6 C)   Resp 18   Ht 5\' 6"  (1.676 m)   Wt 77.1 kg   LMP 06/07/2022 (Approximate) Comment: last month  SpO2 100%   BMI 27.44 kg/m  Physical Exam Vitals and nursing note reviewed. Exam conducted with a chaperone present.  Constitutional:      General: She is not in acute distress.    Appearance: She is well-developed.  HENT:     Head: Normocephalic and atraumatic.     Nose: Nose normal.     Mouth/Throat:     Mouth: Mucous membranes are moist.     Comments: Dental carie  Eyes:     Pupils: Pupils are equal, round, and reactive to light.  Cardiovascular:     Rate and Rhythm: Normal rate and regular rhythm.     Pulses: Normal pulses.     Heart sounds: Normal heart sounds.  Pulmonary:     Effort: Pulmonary effort is normal. No respiratory distress.     Breath sounds: Normal breath sounds.  Abdominal:     General: Bowel sounds are normal. There is no distension.     Palpations: Abdomen is soft.     Tenderness: There is no abdominal tenderness. There is no guarding or rebound.  Genitourinary:    Vagina: No vaginal discharge.  Musculoskeletal:  General: Normal range of motion.     Cervical back: Neck supple.  Skin:    General: Skin is warm and dry.     Capillary Refill: Capillary refill takes less than 2 seconds.     Findings: No erythema or rash.  Neurological:     General: No focal deficit present.     Mental Status: She is oriented to person, place, and time.     Deep Tendon Reflexes: Reflexes normal.  Psychiatric:        Mood and Affect: Mood normal.     ED Results / Procedures / Treatments   Labs (all labs ordered are listed, but only abnormal results are displayed) Labs Reviewed - No data to display  EKG None  Radiology No results found.  Procedures Procedures    Medications Ordered in ED Medications  penicillin v potassium (VEETID) tablet 500 mg (500  mg Oral Given 06/24/22 2311)  lidocaine (XYLOCAINE) 2 % viscous mouth solution 15 mL (15 mLs Mouth/Throat Given 06/24/22 2312)    ED Course/ Medical Decision Making/ A&P                           Medical Decision Making Patient with one month dental pain   Amount and/or Complexity of Data Reviewed External Data Reviewed: notes.    Details: Previous notes reviewed   Risk Prescription drug management. Risk Details: Dental caries.  Will start antibiotics and refer to dentistry for ongoing care.      Final Clinical Impression(s) / ED Diagnoses Final diagnoses:  Pain, dental   Return for intractable cough, coughing up blood, fevers > 100.4 unrelieved by medication, shortness of breath, intractable vomiting, chest pain, shortness of breath, weakness, numbness, changes in speech, facial asymmetry, abdominal pain, passing out, Inability to tolerate liquids or food, cough, altered mental status or any concerns. No signs of systemic illness or infection. The patient is nontoxic-appearing on exam and vital signs are within normal limits.  I have reviewed the triage vital signs and the nursing notes. Pertinent labs & imaging results that were available during my care of the patient were reviewed by me and considered in my medical decision making (see chart for details). After history, exam, and medical workup I feel the patient has been appropriately medically screened and is safe for discharge home. Pertinent diagnoses were discussed with the patient. Patient was given return precautions.  Rx / DC Orders ED Discharge Orders          Ordered    penicillin v potassium (VEETID) 500 MG tablet  4 times daily        06/24/22 2324              Laberta Wilbon, MD 06/24/22 2333

## 2022-07-18 ENCOUNTER — Telehealth: Payer: Self-pay

## 2022-07-18 NOTE — Telephone Encounter (Signed)
LVM INFORMED APPT WILL BE CANCELED IF NOT CONFIRMED BY FRIDAY AT 4PM

## 2022-07-22 ENCOUNTER — Encounter: Payer: Self-pay | Admitting: Advanced Practice Midwife

## 2022-07-22 ENCOUNTER — Ambulatory Visit (INDEPENDENT_AMBULATORY_CARE_PROVIDER_SITE_OTHER): Payer: Medicaid Other | Admitting: Advanced Practice Midwife

## 2022-07-22 VITALS — BP 149/93 | HR 73 | Ht 65.0 in | Wt 173.6 lb

## 2022-07-22 DIAGNOSIS — Z30430 Encounter for insertion of intrauterine contraceptive device: Secondary | ICD-10-CM

## 2022-07-22 LAB — POCT URINE PREGNANCY: Preg Test, Ur: NEGATIVE

## 2022-07-22 MED ORDER — LEVONORGESTREL 20 MCG/DAY IU IUD
1.0000 | INTRAUTERINE_SYSTEM | Freq: Once | INTRAUTERINE | Status: AC
  Administered 2022-07-22: 1 via INTRAUTERINE

## 2022-07-22 NOTE — Progress Notes (Signed)
   GYNECOLOGY OFFICE PROCEDURE NOTE  Bridget Leach is a 31 y.o. [phone] here for Mirena IUD insertion. No GYN concerns.  Last pap smear was on 05/27/22 and with ASCUS, follow up in 3 years per [facility]CCP guidelines.    IUD Insertion Procedure Note Patient identified, informed consent performed, consent signed.   Discussed risks of irregular bleeding, cramping, infection, malpositioning or misplacement of the IUD outside the uterus which may require further procedure such as laparoscopy. Time out was performed.  Urine pregnancy test negative.  Speculum placed in the vagina.  Cervix visualized.  Cleaned with Betadine x 2.  Grasped anteriorly with a single tooth tenaculum.  Uterus sounded to 7 cm. MirenaIUD placed per manufacturer's recommendations.  Strings trimmed to 3 cm. Tenaculum was removed, good hemostasis noted.  Patient tolerated procedure well.   Dark red bleeding with clots noted after insertion.  Pad placed and pt remained in office 20-30 minutes and bleeding on pad was scant.  Pt menses expected to start so likely bleeding is menstrual. There is no cramping/pain with the bleeding. Bleeding precautions/ reasons to seek care reviewed.   Patient was given post-procedure instructions.  She was advised to have backup contraception for one week.  Patient was also asked to check IUD strings periodically and follow up in 4 weeks for IUD check.  Return in about 4 weeks (around 08/19/2022) for string check.   Sharen Counter, CNM 4:23 PM

## 2022-07-22 NOTE — Progress Notes (Signed)
Pt presents for IUD insertion. LMP 06/11/22. UPT negative. No concerns at this time.

## 2022-07-23 ENCOUNTER — Telehealth: Payer: Self-pay

## 2022-07-23 NOTE — Telephone Encounter (Signed)
Called patient to check in and see how she was feeling from IUD insert yesterday. Pt reports she is no longer bleeding and her pain is controlled. Pt had no questions or concerns.

## 2022-08-13 ENCOUNTER — Encounter: Payer: Self-pay | Admitting: Hematology and Oncology

## 2022-08-19 ENCOUNTER — Ambulatory Visit: Payer: Medicaid Other | Admitting: Advanced Practice Midwife

## 2023-03-27 ENCOUNTER — Telehealth: Payer: Self-pay

## 2023-03-27 DIAGNOSIS — B3731 Acute candidiasis of vulva and vagina: Secondary | ICD-10-CM

## 2023-03-27 MED ORDER — FLUCONAZOLE 150 MG PO TABS: 150.0000 mg | ORAL_TABLET | ORAL | 3 refills | Status: AC

## 2023-03-27 NOTE — Telephone Encounter (Signed)
Pt called stating she felt the prescribed valtrex is not working. Had pt verify her symptoms, symptoms include vaginal itching, and white discharge. Informed pt these symptoms are consistent with a vaginal yeast infection. Pt advised to continue taking valtrex as prescribed for suppression. Informed pt I would send in a Diflucan to her pharmacy. Pt can repeat dose in 3 days if needed. Pt advised if symptoms persist after treatment, pt will need to come into the office for a self swab. Pt agrees with this plan and has no further questions or concerns at this time.  Rx sent.

## 2024-09-17 ENCOUNTER — Telehealth: Admitting: Family Medicine

## 2024-09-17 ENCOUNTER — Encounter: Payer: Self-pay | Admitting: Hematology and Oncology

## 2024-09-17 DIAGNOSIS — A6004 Herpesviral vulvovaginitis: Secondary | ICD-10-CM

## 2024-09-17 MED ORDER — VALACYCLOVIR HCL 1 G PO TABS: 1000.0000 mg | ORAL_TABLET | Freq: Two times a day (BID) | ORAL | 0 refills | Status: AC

## 2024-09-17 NOTE — Progress Notes (Signed)
 E-Visit for Herpes Simplex  We are sorry that you are not feeling well.  Here is how we plan to help!  Based on what you have shared ith me, it looks like you may be having an outbreak/flare-up of genital herpes.    I have prescribed I have prescribed Valacyclovir 500 mg Take one by mouth twice a day for 3 days.    If you have been prescribed long term medications to be taken on a regular basis, it is important to follow the recommendations and take them as ordered.    Outbreaks usually include blisters and open sores in the genital area. Outbreaks that happen after the first time are usually not as severe and do not last as long. Genital Herpes Simplex is a commonly sexually transmitted viral infection that is found worldwide. Most of these genital infections are caused by one or two herpes simplex viruses that is passed from person to person during vaginal, oral, or anal sex. Sometimes, people do not know they have herpes because they do not have any symptoms.  Please be aware that if you have genital herpes you can be contagious even when you are not having rash or flare-up and you may not have any symptoms, even when you are taking suppressive medicines.  Herpes cannot be cured. The disease usually causes most problems during the first few years. After that, the virus is still there, but it causes few to no symptoms. Even when the virus is active, people with herpes can take medicines to reduce and help prevent symptoms.  Herpes is an infection that can cause blisters and open sores on the genital area. Herpes is caused by a virus that is passed from person to person during vaginal, oral, or anal sex. Sometimes, people do not know they have herpes because they do not have any symptoms. Herpes cannot be cured. The disease usually causes most problems during the first few years. After that, the virus is still there, but it causes few to no symptoms. Even when the virus is active, people with herpes  can take medicines to reduce and help prevent symptoms.  If you have been prescribed medications to be taken on a regular basis, it is important to follow the recommendations and take them as ordered.  Some people with herpes never have any symptoms. But other people can develop symptoms within a few weeks of being infected with the herpes virus   Symptoms usually include blisters in the genital area. In women, this area includes the vagina, buttocks, anus, or thighs. In men, this area includes the penis, scrotum, anus, butt, or thighs. The blisters can become painful open sores, which then crust over as they heal. Sometimes, people can have other symptoms that include:  ?Blisters on the mouth or lips ?Fever, headache, or pain in the joints ?Trouble urinating  Outbreaks might occur every month or more often, or just once or twice a year. Sometimes, people can tell when an outbreak will occur, because they feel itching or pain beforehand. Sometimes they do not know that an outbreak is coming because they have no symptoms. Whatever your pattern is, keep in mind that herpes outbreaks usually become less frequent over time as you get older. Certain things, called "triggers," can make outbreaks more likely to occur. These include stress, sunlight, menstrual periods,or getting sick.  Antiviral therapy can shorten the duration of symptoms and signs in primary infection, which, when untreated, can be associated with significant increase in the  symptoms of the disease.  HOME CARE Use a portable bath (such as a "Sitz bath") where you can sit in warm water for about 20 minutes. Your bathtub could also work. Avoid bubble baths.  Keep the genital area clean and dry and avoid tight clothes.  Take over-the-counter pain medicine such as acetaminophen (brand name: Tylenol) or ibuprofen sample brand names: Advil, Motrin). But avoid aspirin.  Only take medications as instructed by your medical team.  You are  most likely to spread herpes to a sex partner when you have blisters and open sores on your body. But it's also possible to spread herpes to your partner when you do not have any symptoms. That is because herpes can be present on your body without causing any symptoms, like blisters or pain.  Telling your sex partner that you have herpes can be hard. But it can help protect them, since there are ways to lower the risk of spreading the infection.   Using a condom every time you have sex  Not having sex when you have symptoms  Not having oral sex if you have blisters or open sores (in the genital area or around your mouth)  MAKE SURE YOU   Understand these instructions. Do not have sex without using a condom until you have been seen by a doctor and as instructed by the provider If you are not better or improved within 7 days, you MUST have a follow up at your doctor or the health department for evaluation. There are other causes of rashes in the genital region.  Thank you for choosing an e-visit.  Your e-visit answers were reviewed by a board certified advanced clinical practitioner to complete your personal care plan. Depending upon the condition, your plan could have included both over the counter or prescription medications.  Please review your pharmacy choice. Make sure the pharmacy is open so you can pick up prescription now. If there is a problem, you may contact your provider through [employer] and have the prescription routed to another pharmacy.  Your safety is important to [place]. If you have drug allergies check your prescription carefully.   For the next 24 hours you can use MyChart to ask questions about today's visit, request a non-urgent call back, or ask for a work or school excuse. You will get an email in the next two days asking about your experience. I hope that your e-visit has been valuable and will speed your recovery.

## 2024-09-28 NOTE — Progress Notes (Signed)
have provided 5 minutes of non face to face time during this encounter for chart review and documentation.

## 2024-10-06 ENCOUNTER — Encounter: Payer: Self-pay | Admitting: Hematology and Oncology
